# Patient Record
Sex: Male | Born: 1980 | Race: Black or African American | Hispanic: No | Marital: Single | Smoking: Current every day smoker
Health system: Southern US, Community
[De-identification: ages and names within clinical notes are randomized; demographics above are authoritative.]

## PROBLEM LIST (undated history)

## (undated) DIAGNOSIS — I10 Essential (primary) hypertension: Secondary | ICD-10-CM

## (undated) DIAGNOSIS — K649 Unspecified hemorrhoids: Secondary | ICD-10-CM

---

## 2012-06-24 ENCOUNTER — Ambulatory Visit (INDEPENDENT_AMBULATORY_CARE_PROVIDER_SITE_OTHER): Payer: Self-pay | Admitting: General Surgery

## 2012-07-22 ENCOUNTER — Ambulatory Visit (INDEPENDENT_AMBULATORY_CARE_PROVIDER_SITE_OTHER): Payer: Self-pay | Admitting: General Surgery

## 2012-08-26 ENCOUNTER — Encounter (INDEPENDENT_AMBULATORY_CARE_PROVIDER_SITE_OTHER): Payer: Self-pay | Admitting: General Surgery

## 2012-08-26 ENCOUNTER — Ambulatory Visit (INDEPENDENT_AMBULATORY_CARE_PROVIDER_SITE_OTHER): Payer: Self-pay | Admitting: General Surgery

## 2012-08-26 VITALS — BP 122/78 | HR 82 | Temp 98.4°F | Resp 14 | Ht 71.0 in | Wt 196.0 lb

## 2012-08-26 DIAGNOSIS — K648 Other hemorrhoids: Secondary | ICD-10-CM | POA: Insufficient documentation

## 2012-08-26 MED ORDER — HYDROCORTISONE ACETATE 25 MG RE SUPP: 25.0000 mg | Freq: Two times a day (BID) | RECTAL | Status: DC

## 2012-08-26 NOTE — Progress Notes (Signed)
Patient ID: Kyle Rivera, male   DOB: 1981/02/22, 30 y.o.   MRN: [id number]  Chief Complaint  Patient presents with  . Rectal Problems    HPI Kyle Rivera is a 31 y.o. male.  Dr Raenette Rover HPI This is a 31 year old male who is otherwise healthy who presents with about one year ago where he was diagnosed with an anal fissure. At that point in time he was having painful bowel movements and noted some bright red blood on the tissue. He was treated conservatively for an acute fissure with lifestyle changes, fiber, and water. He is not sitting on the toilet for a long time Over the last year he said about 4 or 5 episodes of some occasional discomfort or significant pain associated with some bright red blood that he noticed on the toilet paper. He has one to 2 bowel movements per day. He does not really do any straining. He has some occasional loose stools that are associated with that as well. He has been taking fiber. He comes in today to discuss possible options for his anal bright red blood and pain. History reviewed. No pertinent past medical history.  History reviewed. No pertinent past surgical history.  Family History  Problem Relation Age of Onset  . Heart disease Mother     Social History History  Substance Use Topics  . Smoking status: Never Smoker   . Smokeless tobacco: Not on file  . Alcohol Use: Yes     occ    No Known Allergies  Current Outpatient Prescriptions  Medication Sig Dispense Refill  . hydrocortisone (ANUSOL-HC) 25 MG suppository Place 1 suppository (25 mg total) rectally 2 (two) times daily.  12 suppository  0    Review of Systems Review of Systems  Constitutional: Negative for fever, chills and unexpected weight change.  HENT: Negative for hearing loss, congestion, sore throat, trouble swallowing and voice change.   Eyes: Negative for visual disturbance.  Respiratory: Negative for cough and wheezing.   Cardiovascular: Negative for chest pain, palpitations and  leg swelling.  Gastrointestinal: Positive for diarrhea and anal bleeding. Negative for nausea, vomiting, abdominal pain, constipation, blood in stool, abdominal distention and rectal pain.  Genitourinary: Negative for hematuria and difficulty urinating.  Musculoskeletal: Negative for arthralgias.  Skin: Negative for rash and wound.  Neurological: Negative for seizures, syncope, weakness and headaches.  Hematological: Negative for adenopathy. Does not bruise/bleed easily.  Psychiatric/Behavioral: Negative for confusion.    Blood pressure 122/78, pulse 82, temperature 98.4 F (36.9 C), temperature source Temporal, resp. rate 14, height 5\' 11"  (1.803 m), weight 196 lb (88.905 kg).  Physical Exam Physical Exam  Vitals reviewed. Constitutional: He appears well-developed and well-nourished.  Genitourinary: Rectal exam shows tenderness. Rectal exam shows no external hemorrhoid, no fissure, no mass and anal tone normal. Prostate is not enlarged and not tender.      Assessment    Likely internal hemorrhoids    Plan    We had a  long discussion today about hemorrhoids and fissures. I think on his exam he most likely has internal hemorrhoids as a source of his pain. I did not see any evidence of a chronic fissure today at all. I was however unable to do a really adequate examination with the endoscope due to the fact that he was having pain upon a rectal exam. We'll plan on trying some anusol suppositories and see him back in a few weeks to see the dictated that are so we can do eventually  an adequate exam on him.       Hayla Hinger 08/26/2012, 2:30 PM

## 2012-09-19 ENCOUNTER — Encounter (INDEPENDENT_AMBULATORY_CARE_PROVIDER_SITE_OTHER): Payer: Self-pay | Admitting: General Surgery

## 2012-09-23 ENCOUNTER — Telehealth (INDEPENDENT_AMBULATORY_CARE_PROVIDER_SITE_OTHER): Payer: Self-pay | Admitting: General Surgery

## 2012-09-23 NOTE — Telephone Encounter (Signed)
Pt called for refill on suppositories; last issued 08/26/12.  Called in to [facility]:  [phone]---Hydrocortisone AC 25 mg suppositories, # 12, place 1 suppository rectally up to twice a day prn pain, no refill.

## 2012-10-18 ENCOUNTER — Encounter (INDEPENDENT_AMBULATORY_CARE_PROVIDER_SITE_OTHER): Payer: Self-pay | Admitting: General Surgery

## 2012-11-05 ENCOUNTER — Encounter (INDEPENDENT_AMBULATORY_CARE_PROVIDER_SITE_OTHER): Payer: Self-pay | Admitting: General Surgery

## 2012-11-08 ENCOUNTER — Telehealth (INDEPENDENT_AMBULATORY_CARE_PROVIDER_SITE_OTHER): Payer: Self-pay | Admitting: General Surgery

## 2012-11-08 NOTE — Telephone Encounter (Signed)
Patient called in stating his hemorrhoids have gotten worse and wanted to be seen by Dr. Dwain Rivera. Advised the patient the only appointment (post op) available was 12/05/12. Patient did not seem very coherent and had to repeat the same thing several times to him. Patient expected to be seen today, advised patient Dr. Dwain Rivera is not in clinic this afternoon. His situation did not qualify for urgent office (not thrombosed). Patient advised a message would be sent to Dr. Doreen Salvage nurse and if there was a cancellation and there was time to contact him, the appointment would be moved up. Patient instructed to do warm baths 3 or 4 times per day, increase his water intake and eat smaller meals in order to ease his bowel movements. Patient is not using anything topically to help resolve the hemorrhoids. Advised patient to try prep-h and see if that would help him in the mean time. [name]. Had to be reminded multiple times it was the end of October.

## 2012-12-05 ENCOUNTER — Encounter (INDEPENDENT_AMBULATORY_CARE_PROVIDER_SITE_OTHER): Payer: Self-pay | Admitting: General Surgery

## 2012-12-06 ENCOUNTER — Encounter (INDEPENDENT_AMBULATORY_CARE_PROVIDER_SITE_OTHER): Payer: Self-pay | Admitting: General Surgery

## 2013-01-06 ENCOUNTER — Encounter (INDEPENDENT_AMBULATORY_CARE_PROVIDER_SITE_OTHER): Payer: Self-pay | Admitting: General Surgery

## 2013-03-03 ENCOUNTER — Encounter (INDEPENDENT_AMBULATORY_CARE_PROVIDER_SITE_OTHER): Payer: Self-pay | Admitting: General Surgery

## 2013-05-02 ENCOUNTER — Emergency Department (INDEPENDENT_AMBULATORY_CARE_PROVIDER_SITE_OTHER)
Admission: EM | Admit: 2013-05-02 | Discharge: 2013-05-02 | Disposition: A | Discharge status: Home / Self Care | Payer: BC Managed Care – PPO | Source: Home / Self Care | Attending: Emergency Medicine | Admitting: Emergency Medicine

## 2013-05-02 ENCOUNTER — Encounter (HOSPITAL_COMMUNITY): Payer: Self-pay | Admitting: *Deleted

## 2013-05-02 DIAGNOSIS — K645 Perianal venous thrombosis: Secondary | ICD-10-CM

## 2013-05-02 HISTORY — DX: Unspecified hemorrhoids: K64.9

## 2013-05-02 MED ORDER — IBUPROFEN 800 MG PO TABS: 800.0000 mg | ORAL_TABLET | Freq: Once | ORAL | Status: DC

## 2013-05-02 MED ORDER — PRAMOXINE-HC 1-1 % EX CREA: TOPICAL_CREAM | Freq: Three times a day (TID) | CUTANEOUS | Status: DC

## 2013-05-02 MED ORDER — IBUPROFEN 800 MG PO TABS
ORAL_TABLET | ORAL | Status: AC
  Filled 2013-05-02: qty 1

## 2013-05-02 MED ORDER — IBUPROFEN 800 MG PO TABS
800.0000 mg | ORAL_TABLET | Freq: Once | ORAL | Status: AC
  Administered 2013-05-02: 800 mg via ORAL

## 2013-05-02 MED ORDER — HYDROCORTISONE ACETATE 25 MG RE SUPP: 25.0000 mg | Freq: Two times a day (BID) | RECTAL | Status: DC

## 2013-05-02 MED ORDER — OXYCODONE-ACETAMINOPHEN 5-325 MG PO TABS: ORAL_TABLET | ORAL | Status: DC

## 2013-05-02 NOTE — ED Provider Notes (Signed)
Chief Complaint:   Chief Complaint  Patient presents with  . Hemorrhoids    History of Present Illness:    Kyle Rivera is a 32 year old male who has had a several year history of recurring hemorrhoids. He has seen a Careers adviser for this and was prescribed suppositories. He had a flareup about 3 hours ago with rectal pain, and he can feel something protruding externally. He denies any bleeding. He's had no diarrhea, constipation, abdominal pain, or fever.  Review of Systems:  Other than noted above, the patient denies any of the following symptoms: Constitutional:  No fever, chills, fatigue, weight loss or anorexia. Abdomen:  No nausea, vomiting, hematememesis, melena, constipation, or diarrhea. GU:  No dysuria, frequency, urgency, or hematuria.   Skin:  No rash or itching.  PMFSH:  Past medical history, family history, social history, meds, and allergies were reviewed along with nurse's notes.  No prior abdominal surgeries or history of GI problems.  No use of NSAIDs or aspirin.  No excessive  alcohol intake.    Physical Exam:   Vital signs:  BP 158/107  Pulse 62  Temp(Src) 98.4 F (36.9 C) (Oral)  Resp 16  SpO2 100% Gen:  Alert, oriented, in no distress. Abdomen:  Soft, nontender, no organomegaly or mass. Anal exam: He has 2, small thrombosed external hemorrhoids on the right side. These were very tender to touch. Because of patient discomfort, did not attempt digital rectal exam or endoscopic exam. Skin:  Clear, warm and dry.  No rash.  Course in Urgent Care Center:   For pain he was given ibuprofen 800 mg by mouth.  Assessment:  The encounter diagnosis was Thrombosed external hemorrhoid.  I outlined treatment options with him, I offered incision and drainage of the thrombosed external hemorrhoid, but he does not want to have this done. For conservative treatment, therefore was given Pramoxine/hydrocortisone cream, Anusol-HC suppositories, and Percocet for the pain. He was referred to  see surgery if these should persist. Also suggested sitz baths, stool softeners, and use of baby wipes for cleansing.  Plan: 1.  The following meds were prescribed:   New Prescriptions   HYDROCORTISONE (ANUSOL-HC) 25 MG SUPPOSITORY    Place 1 suppository (25 mg total) rectally 2 (two) times daily.   OXYCODONE-ACETAMINOPHEN (PERCOCET) 5-325 MG PER TABLET    1 to 2 tablets every 6 hours as needed for pain.   PRAMOXINE-HYDROCORTISONE (PRAMOSONE) CREAM    Apply topically 3 (three) times daily.   2.  The patient was instructed in symptomatic care and handouts were given. 3.  The patient was told to return if becoming worse in any way, if no better in 3 or 4 days, and given some red flag symptoms such as rectal bleeding or increasing pain  that would indicate earlier return. 4.  Follow up  with Dr. Avel Peace if symptoms should persist.      Reuben Likes, MD 05/02/13 2029

## 2013-05-02 NOTE — ED Notes (Signed)
C/O hemorrhoidal pain x 3 hrs without bleeding.  Has tried lido application without any relief.

## 2013-05-07 ENCOUNTER — Encounter (HOSPITAL_COMMUNITY): Payer: Self-pay | Admitting: Emergency Medicine

## 2013-05-07 ENCOUNTER — Telehealth (HOSPITAL_COMMUNITY): Payer: Self-pay | Admitting: *Deleted

## 2013-05-07 ENCOUNTER — Emergency Department (HOSPITAL_COMMUNITY)
Admission: EM | Admit: 2013-05-07 | Discharge: 2013-05-07 | Disposition: A | Discharge status: Home / Self Care | Payer: BC Managed Care – PPO | Source: Home / Self Care | Attending: Emergency Medicine | Admitting: Emergency Medicine

## 2013-05-07 DIAGNOSIS — K645 Perianal venous thrombosis: Secondary | ICD-10-CM

## 2013-05-07 MED ORDER — HYDROCORTISONE ACETATE 25 MG RE SUPP: 25.0000 mg | Freq: Two times a day (BID) | RECTAL | Status: DC

## 2013-05-07 MED ORDER — IBUPROFEN 800 MG PO TABS: 800.0000 mg | ORAL_TABLET | Freq: Three times a day (TID) | ORAL | Status: DC | PRN

## 2013-05-07 NOTE — ED Provider Notes (Signed)
History    CSN: [id number] Arrival date & time 05/07/13  4098  First MD Initiated Contact with Patient 05/07/13 0845     Chief Complaint  Patient presents with  . Hemorrhoids   (Consider location/radiation/quality/duration/timing/severity/associated sxs/prior Treatment) HPI Comments: Patient presents urgent care with ongoing rectal pain from a thrombosed hemorrhoid as he was diagnosed on July 4. At that time patient refused to have any surgical procedure to evacuate the thrombosed Extrenal Hem. He has been taking Percocet and using rectal suppositories and applying a prescribed cream. He continues to have pain.Kyle Rivera  decided to come in to have this procedure done. He has seen a surgeon in the past for hemorrhoids as well.   He opted to come here and in did not call his Careers adviser.   Patient denies any fevers or abdominal pain associated with this rectal pain.   The history is provided by the patient.   Past Medical History  Diagnosis Date  . Hemorrhoid    History reviewed. No pertinent past surgical history. Family History  Problem Relation Age of Onset  . Heart disease Mother    History  Substance Use Topics  . Smoking status: Current Every Day Smoker  . Smokeless tobacco: Not on file  . Alcohol Use: Yes     Comment: occasional    Review of Systems  Constitutional: Negative for fever, activity change and appetite change.  Gastrointestinal: Positive for rectal pain. Negative for nausea, vomiting, abdominal pain, diarrhea, blood in stool, abdominal distention and anal bleeding.  Genitourinary: Negative for dysuria, flank pain, discharge and genital sores.  Skin: Negative for color change and rash.    Allergies  Review of patient's allergies indicates no known allergies.  Home Medications   Current Outpatient Rx  Name  Route  Sig  Dispense  Refill  . oxyCODONE-acetaminophen (PERCOCET) 5-325 MG per tablet      1 to 2 tablets every 6 hours as needed for  pain.   20 tablet   0   . pramoxine-hydrocortisone (PRAMOSONE) cream   Topical   Apply topically 3 (three) times daily.   30 g   0   . hydrocortisone (ANUSOL-HC) 25 MG suppository   Rectal   Place 1 suppository (25 mg total) rectally 2 (two) times daily.   12 suppository   0   . hydrocortisone (ANUSOL-HC) 25 MG suppository   Rectal   Place 1 suppository (25 mg total) rectally 2 (two) times daily.   12 suppository   0   . ibuprofen (ADVIL,MOTRIN) 800 MG tablet   Oral   Take 1 tablet (800 mg total) by mouth every 8 (eight) hours as needed for pain.   15 tablet   0    BP 158/102  Pulse 65  Temp(Src) 98.3 F (36.8 C) (Oral)  Resp 18  SpO2 97% Physical Exam  Vitals reviewed. Constitutional: He appears well-developed and well-nourished.  Abdominal: Soft. There is no tenderness. Hernia confirmed negative in the right inguinal area and confirmed negative in the left inguinal area.  Genitourinary: Rectal exam shows external hemorrhoid. Rectal exam shows no fissure, no mass and anal tone normal. Right testis shows no tenderness. Left testis shows no tenderness. No penile tenderness.     Neurological: He is alert.  Skin: No erythema.    ED Course  INCISION AND DRAINAGE Date/Time: 05/07/2013 9:41 AM Performed by: Bradin Mcadory Authorized by: Jimmie Molly Consent: Verbal consent obtained. Consent given by: patient Patient understanding: patient states understanding of  the procedure being performed Type: hematoma Body area: anogenital Location details: perianal Anesthesia: local infiltration Local anesthetic: lidocaine 1% without epinephrine (5) Anesthetic total: 5 ml Scalpel size: 11 Incision type: elliptical Complexity: simple Drainage: bloody Wound treatment: wound left open Comments: Excised external hemorrhoid.  After application of local anesthetic and testing patient did not perceive a needle or scalpel puncture-attempts. But during withdrawal procedure  Describing pain at times.    (including critical care time) Labs Reviewed - No data to display No results found. 1. Thrombosed hemorrhoids    Of note patient seemed not to tolerate procedure well despite the use of local anesthetic insufficient amounts local 2 hemorrhoids. MDM   Problem #1 External thrombosed hemorrhoid.  Patient apprehensive anxious during procedure.  Patient has been referred to see his surgeon that he has seen in the past for hemorrhoids.  Have discussed with patient if no significant improvement in 48 hours to call his surgeon. As a was recommended on July 4th on his first visit.  Jimmie Molly, MD 05/07/13 [phone]

## 2013-05-07 NOTE — ED Notes (Signed)
States he has hemorrhoids on and off but recently since July 3.   Patient came here and was given medication and a return trip here or to [place] surgery.  Patient states he was waiting to see if the hemorrhoid was going to subside.

## 2013-05-07 NOTE — ED Notes (Signed)
Pt. called on VM and c/o burning pain after hemorrhoid surgery today.  I called pt. back and asked what he was taking for pain. He said Ibuprofen 800 mg.  I accessed his chart and asked him if he was using the Pramoxine cream. He said it burns. I told him it would burn at first but it would help to numb it up.  He is not using the Anusol supp. either. He asked if it will help with the cut?  I told him to try both the supp. and the cream and see if they will help.  Keep taking the Ibuprofen every 8 hrs.  Pt. thanked me for calling back. [name] 05/07/2013

## 2013-05-10 ENCOUNTER — Emergency Department (INDEPENDENT_AMBULATORY_CARE_PROVIDER_SITE_OTHER)
Admission: EM | Admit: 2013-05-10 | Discharge: 2013-05-10 | Disposition: A | Discharge status: Home / Self Care | Payer: BC Managed Care – PPO | Source: Home / Self Care | Attending: Emergency Medicine | Admitting: Emergency Medicine

## 2013-05-10 ENCOUNTER — Encounter (HOSPITAL_COMMUNITY): Payer: Self-pay | Admitting: *Deleted

## 2013-05-10 DIAGNOSIS — K649 Unspecified hemorrhoids: Secondary | ICD-10-CM

## 2013-05-10 MED ORDER — HYDROCORTISONE ACETATE 25 MG RE SUPP: 25.0000 mg | Freq: Two times a day (BID) | RECTAL | Status: DC

## 2013-05-10 MED ORDER — HYDROCODONE-ACETAMINOPHEN 5-325 MG PO TABS: 2.0000 | ORAL_TABLET | ORAL | Status: DC | PRN

## 2013-05-10 NOTE — ED Notes (Signed)
Pt  Had     questians   About  His  Discharge    Plan of  Care         Kyle Rivera  Returned  And  Spoke  To the  Pt  At  [employer]

## 2013-05-10 NOTE — ED Notes (Signed)
Pt  Advised  The  Importance  Of        Avoiding  Constipation           To  Take  Stool  softners          And  Advised  The  Hydrocodone  May  Make  Him  Constipated

## 2013-05-10 NOTE — ED Provider Notes (Signed)
Medical screening examination/treatment/procedure(s) were performed by non-physician practitioner and as supervising physician I was immediately available for consultation/collaboration.  Raynald Blend, MD 05/10/13 [phone]

## 2013-05-10 NOTE — ED Notes (Signed)
Pt  Was  Seen   sev  Days  Ago  For  Thrombosed  hemmorhoid         He  Reports  Pain  When  He  Has  A  bm  And  He  Feels as  If  His  Rectum is  Swollen    He  States  He   Has  Taken all of  His  RX      And  Has  Been  Using  His       Own  Stool  softner      He  denys  Any bleeding

## 2013-05-10 NOTE — ED Provider Notes (Signed)
History    CSN: [id number] Arrival date & time 05/10/13  1354  First MD Initiated Contact with Patient 05/10/13 1531     Chief Complaint  Patient presents with  . Hemorrhoids   (Consider location/radiation/quality/duration/timing/severity/associated sxs/prior Treatment) Patient is a 32 y.o. male presenting with wound check. The history is provided by the patient. No language interpreter was used.  Wound Check This is a new problem. The problem occurs constantly. The problem has not changed since onset.Nothing aggravates the symptoms. Nothing relieves the symptoms. He has tried nothing for the symptoms. The treatment provided no relief.  Pt reports he had Hemorid drained 3 days.  Pt reports he is having worse pain now. Hemmorrhoid has not decreased in size.  Pt thinks areas are worse.   Pt is out of Anusol suppositories.   Past Medical History  Diagnosis Date  . Hemorrhoid    History reviewed. No pertinent past surgical history. Family History  Problem Relation Age of Onset  . Heart disease Mother    History  Substance Use Topics  . Smoking status: Current Every Day Smoker  . Smokeless tobacco: Not on file  . Alcohol Use: Yes     Comment: occasional    Review of Systems  Gastrointestinal: Positive for rectal pain.  All other systems reviewed and are negative.    Allergies  Review of patient's allergies indicates no known allergies.  Home Medications   Current Outpatient Rx  Name  Route  Sig  Dispense  Refill  . hydrocortisone (ANUSOL-HC) 25 MG suppository   Rectal   Place 1 suppository (25 mg total) rectally 2 (two) times daily.   12 suppository   0   . hydrocortisone (ANUSOL-HC) 25 MG suppository   Rectal   Place 1 suppository (25 mg total) rectally 2 (two) times daily.   12 suppository   0   . ibuprofen (ADVIL,MOTRIN) 800 MG tablet   Oral   Take 1 tablet (800 mg total) by mouth every 8 (eight) hours as needed for pain.   15 tablet   0   .  oxyCODONE-acetaminophen (PERCOCET) 5-325 MG per tablet      1 to 2 tablets every 6 hours as needed for pain.   20 tablet   0   . pramoxine-hydrocortisone (PRAMOSONE) cream   Topical   Apply topically 3 (three) times daily.   30 g   0    BP 152/96  Pulse 77  Temp(Src) 99.8 F (37.7 C) (Oral)  Resp 16  SpO2 98% Physical Exam  Nursing note and vitals reviewed. Constitutional: He appears well-developed and well-nourished.  HENT:  Head: Normocephalic.  Cardiovascular: Normal rate.   Pulmonary/Chest: Effort normal.  Genitourinary:  Pt has healing small incision,   Small thrombosed hemmoroid at incision site and beside  Musculoskeletal: Normal range of motion.  Neurological: He is alert.  Skin: Skin is warm.    ED Course  Procedures (including critical care time) Labs Reviewed - No data to display No results found. 1. Hemorrhoid     MDM  Pt concerned because incision did not make area go away.   I advised him that sometimes the hemorrhoidal vein can make more clot.   Pt advised to continue to use anusol.   Pt has multiple concerns.   I advised him to see [place] surgery.   I do not see tissue that requires current intervention.   Pt is given rx for hydrocodone for pain and anusol.   (Pt  says he can not see surgeon for a month)   Pt is given rx for a box of 100 suppositories as he is worried he will run out and continue having hemorrhoid problems.  Lonia Skinner [place], PA-C 05/10/13 1627

## 2017-07-31 ENCOUNTER — Emergency Department (HOSPITAL_COMMUNITY): Payer: Self-pay

## 2017-07-31 ENCOUNTER — Encounter (HOSPITAL_COMMUNITY): Payer: Self-pay | Admitting: Nurse Practitioner

## 2017-07-31 ENCOUNTER — Emergency Department (HOSPITAL_COMMUNITY)
Admission: EM | Admit: 2017-07-31 | Discharge: 2017-07-31 | Disposition: A | Discharge status: Home / Self Care | Payer: Self-pay | Attending: Emergency Medicine | Admitting: Emergency Medicine

## 2017-07-31 DIAGNOSIS — F17210 Nicotine dependence, cigarettes, uncomplicated: Secondary | ICD-10-CM | POA: Insufficient documentation

## 2017-07-31 DIAGNOSIS — N503 Cyst of epididymis: Secondary | ICD-10-CM | POA: Insufficient documentation

## 2017-07-31 NOTE — ED Notes (Signed)
Pt still refusing vitals.  

## 2017-07-31 NOTE — ED Triage Notes (Signed)
Patient states he noticed a small nodule on his right testicle today. Denies any pain or swelling at site. Denies any fever, heavy lifting, or pulling. Denies any discharge or bleeding from penis.

## 2017-07-31 NOTE — ED Notes (Signed)
PATIENT REFUSED TO HAVE VITAL SIGNS DONE.

## 2017-07-31 NOTE — ED Provider Notes (Signed)
[facility]-EMERGENCY DEPT Provider Note   CSN: [id number] Arrival date & time: 07/31/17  1141     History   Chief Complaint Chief Complaint  Patient presents with  . Groin Pain    HPI Kyle Rivera is a 36 y.o. male.  HPI   Kyle Rivera is a 36yo male with a history of hypertension, tobacco use who presents to the emergency department for evaluation of right sided testicular mass. Patient states that he has had this for several years, never got around to having it checked. He states that the mass is freely mobile, round. Endorses a mild 3/10 aching pain when he presses on the spot. Denies history of cancer, trauma. No family history of testicular mass that he is aware of. Denies fever, chills, unexpected weight change, erythema or swelling, abdominal pain, nausea/vomiting, hematuria, dysuria, penile discharge, penile pain, flank pain. He states that he is sexually active with one male partner. Is not concerned for STD today. He states that he is very anxious and nervous because he is worried that this is cancer.  Past Medical History:  Diagnosis Date  . Hemorrhoid     Patient Active Problem List   Diagnosis Date Noted  . Internal hemorrhoids 08/26/2012    History reviewed. No pertinent surgical history.     Home Medications    Prior to Admission medications   Not on File    Family History Family History  Problem Relation Age of Onset  . Heart disease Mother     Social History Social History  Substance Use Topics  . Smoking status: Current Every Day Smoker  . Smokeless tobacco: Never Used  . Alcohol use Yes     Comment: occasional     Allergies   Patient has no known allergies.   Review of Systems Review of Systems  Constitutional: Negative for chills, fatigue, fever and unexpected weight change.  Gastrointestinal: Negative for abdominal pain, diarrhea, nausea and vomiting.  Genitourinary: Positive for scrotal swelling and testicular pain. Negative for  decreased urine volume, difficulty urinating, discharge, dysuria, frequency, genital sores, hematuria and penile pain.  Musculoskeletal: Negative for arthralgias.  Skin: Negative for rash and wound.  Psychiatric/Behavioral: The patient is nervous/anxious.      Physical Exam Updated Vital Signs Ht  (1.803 m)   Wt 81.6 kg (180 lb)   BMI 25.10 kg/m   Physical Exam  Constitutional: He is oriented to person, place, and time. He appears well-developed and well-nourished. No distress.  Patient appears anxious  HENT:  Head: Normocephalic and atraumatic.  Eyes: Right eye exhibits no discharge. Left eye exhibits no discharge.  Pulmonary/Chest: Effort normal. No respiratory distress.  Abdominal: Soft. Bowel sounds are normal. There is no tenderness.  No CVA tenderness.   Musculoskeletal:  Chaperone present for exam. Right testicular mass noted on the superior pole of the testicle, pea-sized and feels irregular. Mildly painful to touch. No inguinal adenopathy appreciated. No discharge from penis. No signs of lesion or erythema on the penis or testicles. The penis is non-tender. No signs of any inguinal hernias.    Neurological: He is alert and oriented to person, place, and time. Coordination normal.  Skin: Skin is warm and dry. Capillary refill takes less than 2 seconds. No rash noted. He is not diaphoretic. No erythema.  Psychiatric: He has a normal mood and affect. His behavior is normal.  Nursing note and vitals reviewed.    ED Treatments / Results  Labs (all labs ordered are listed, but  only abnormal results are displayed) Labs Reviewed - No data to display  EKG  EKG Interpretation None       Radiology US Scrotum  Result Date: 07/31/2017 CLINICAL DATA:  Right testicular mass for several years EXAM: SCROTAL ULTRASOUND DOPPLER ULTRASOUND OF THE TESTICLES TECHNIQUE: Complete ultrasound examination of the testicles, epididymis, and other scrotal structures was performed.  Color and spectral Doppler ultrasound were also utilized to evaluate blood flow to the testicles. COMPARISON:  None. FINDINGS: Right testicle Measurements: 3.6 x 1.9 x 2.6 cm. No mass or microlithiasis visualized. Left testicle Measurements: 3.4 x 2.2 x 2.5 cm. No mass or microlithiasis visualized. Right epididymis:  Cyst measuring 0.7 x 0.7 x 0.8 cm Left epididymis:  Normal in size and appearance. Hydrocele:  Bilateral hydroceles, right greater than left Varicocele:  Small right varicocele Pulsed Doppler interrogation of both testes demonstrates normal low resistance arterial and venous waveforms bilaterally. IMPRESSION: 1. Negative for testicular torsion or solid or cystic mass within the testes. 2. Right epididymal cyst measuring 8 mm 3. Small medium bilateral hydroceles 4. Small right varicocele, could consider nonemergent CT imaging to exclude retroperitoneal abnormality. Electronically Signed   By: Jasmine Pang M.D.   On: 07/31/2017 17:12   [place] Art/ven Flow Abd Pelv Doppler  Result Date: 07/31/2017 CLINICAL DATA:  Right testicular mass for several years EXAM: SCROTAL ULTRASOUND DOPPLER ULTRASOUND OF THE TESTICLES TECHNIQUE: Complete ultrasound examination of the testicles, epididymis, and other scrotal structures was performed. Color and spectral Doppler ultrasound were also utilized to evaluate blood flow to the testicles. COMPARISON:  None. FINDINGS: Right testicle Measurements: 3.6 x 1.9 x 2.6 cm. No mass or microlithiasis visualized. Left testicle Measurements: 3.4 x 2.2 x 2.5 cm. No mass or microlithiasis visualized. Right epididymis:  Cyst measuring 0.7 x 0.7 x 0.8 cm Left epididymis:  Normal in size and appearance. Hydrocele:  Bilateral hydroceles, right greater than left Varicocele:  Small right varicocele Pulsed Doppler interrogation of both testes demonstrates normal low resistance arterial and venous waveforms bilaterally. IMPRESSION: 1. Negative for testicular torsion or solid or cystic mass  within the testes. 2. Right epididymal cyst measuring 8 mm 3. Small medium bilateral hydroceles 4. Small right varicocele, could consider nonemergent CT imaging to exclude retroperitoneal abnormality. Electronically Signed   By: Jasmine Pang M.D.   On: 07/31/2017 17:12    Procedures Procedures (including critical care time)  Medications Ordered in ED Medications - No data to display   Initial Impression / Assessment and Plan / ED Course  I have reviewed the triage vital signs and the nursing notes.  Pertinent labs & imaging results that were available during my care of the patient were reviewed by me and considered in my medical decision making (see chart for details).    Ultrasound reveals 8 mm epididymal cyst located on the right testicle. Negative for testicular torsion, negative for solid or cystic testicular mass. I have provided patient information to follow up with urology. There is also a small right varicocele, have informed patient that Ultrasound suggests he get a non-emergent CT scan to evaluate for this. Vital signs are stable. Have discussed return precautions including new or worsening testicular pain or any other new or concerning symptoms. Discussed this patient with Dr. Criss Alvine who agrees with plan.  Final Clinical Impressions(s) / ED Diagnoses   Final diagnoses:  Epididymal cyst    New Prescriptions New Prescriptions   No medications on file     Kellie Shropshire, Cordelia Poche 07/31/17 1749  Pricilla Loveless, MD 08/01/17 [phone]

## 2017-07-31 NOTE — Discharge Instructions (Signed)
Your ultrasound revealed that you have an 8 mm epididymal cyst located on the right testicle. Ultrasound was negative for testicular torsion, negative for solid or cystic testicular mass.  You may follow-up with a urologist regarding the cyst. I have listed information to follow up with Dr.Herrick.   Please return to the emergency department if you develop worsening testicular pain, fever or any new or concerning symptoms.

## 2017-08-02 ENCOUNTER — Emergency Department (HOSPITAL_COMMUNITY)
Admission: EM | Admit: 2017-08-02 | Discharge: 2017-08-09 | Disposition: A | Discharge status: Home / Self Care | Payer: Self-pay | Attending: Emergency Medicine | Admitting: Emergency Medicine

## 2017-08-02 ENCOUNTER — Encounter (HOSPITAL_COMMUNITY): Payer: Self-pay | Admitting: Emergency Medicine

## 2017-08-02 DIAGNOSIS — IMO0001 Reserved for inherently not codable concepts without codable children: Secondary | ICD-10-CM | POA: Diagnosis present

## 2017-08-02 DIAGNOSIS — I10 Essential (primary) hypertension: Secondary | ICD-10-CM | POA: Insufficient documentation

## 2017-08-02 DIAGNOSIS — F17210 Nicotine dependence, cigarettes, uncomplicated: Secondary | ICD-10-CM | POA: Insufficient documentation

## 2017-08-02 DIAGNOSIS — F259 Schizoaffective disorder, unspecified: Secondary | ICD-10-CM | POA: Insufficient documentation

## 2017-08-02 DIAGNOSIS — F419 Anxiety disorder, unspecified: Secondary | ICD-10-CM

## 2017-08-02 HISTORY — DX: Essential (primary) hypertension: I10

## 2017-08-02 LAB — CBC
HCT: 48.2 % (ref 39.0–52.0)
Hemoglobin: 17.8 g/dL — ABNORMAL HIGH (ref 13.0–17.0)
MCH: 29.5 pg (ref 26.0–34.0)
MCHC: 36.9 g/dL — ABNORMAL HIGH (ref 30.0–36.0)
MCV: 79.8 fL (ref 78.0–100.0)
PLATELETS: 279 10*3/uL (ref 150–400)
RBC: 6.04 MIL/uL — ABNORMAL HIGH (ref 4.22–5.81)
RDW: 13.2 % (ref 11.5–15.5)
WBC: 11.4 10*3/uL — AB (ref 4.0–10.5)

## 2017-08-02 LAB — RAPID URINE DRUG SCREEN, HOSP PERFORMED
Amphetamines: NOT DETECTED
BENZODIAZEPINES: NOT DETECTED
Barbiturates: NOT DETECTED
Cocaine: NOT DETECTED
OPIATES: NOT DETECTED
Tetrahydrocannabinol: NOT DETECTED

## 2017-08-02 LAB — COMPREHENSIVE METABOLIC PANEL
ALT: 15 U/L — ABNORMAL LOW (ref 17–63)
AST: 31 U/L (ref 15–41)
Albumin: 4.6 g/dL (ref 3.5–5.0)
Alkaline Phosphatase: 79 U/L (ref 38–126)
Anion gap: 10 (ref 5–15)
BILIRUBIN TOTAL: 1 mg/dL (ref 0.3–1.2)
BUN: 7 mg/dL (ref 6–20)
CO2: 21 mmol/L — ABNORMAL LOW (ref 22–32)
Calcium: 9.4 mg/dL (ref 8.9–10.3)
Chloride: 107 mmol/L (ref 101–111)
Creatinine, Ser: 1 mg/dL (ref 0.61–1.24)
Glucose, Bld: 110 mg/dL — ABNORMAL HIGH (ref 65–99)
Potassium: 3.5 mmol/L (ref 3.5–5.1)
Sodium: 138 mmol/L (ref 135–145)
TOTAL PROTEIN: 7.8 g/dL (ref 6.5–8.1)

## 2017-08-02 LAB — SALICYLATE LEVEL

## 2017-08-02 LAB — ETHANOL

## 2017-08-02 LAB — ACETAMINOPHEN LEVEL: Acetaminophen (Tylenol), Serum: 12 ug/mL (ref 10–30)
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
## 2017-08-03 DIAGNOSIS — R454 Irritability and anger: Secondary | ICD-10-CM

## 2017-08-03 DIAGNOSIS — R451 Restlessness and agitation: Secondary | ICD-10-CM

## 2017-08-03 DIAGNOSIS — IMO0001 Reserved for inherently not codable concepts without codable children: Secondary | ICD-10-CM | POA: Diagnosis present

## 2017-08-03 DIAGNOSIS — R45851 Suicidal ideations: Secondary | ICD-10-CM

## 2017-08-03 DIAGNOSIS — F2081 Schizophreniform disorder: Secondary | ICD-10-CM

## 2017-08-03 DIAGNOSIS — R4586 Emotional lability: Secondary | ICD-10-CM

## 2017-08-03 DIAGNOSIS — F17210 Nicotine dependence, cigarettes, uncomplicated: Secondary | ICD-10-CM

## 2017-08-03 DIAGNOSIS — R4587 Impulsiveness: Secondary | ICD-10-CM

## 2017-08-03 DIAGNOSIS — F22 Delusional disorders: Secondary | ICD-10-CM

## 2017-08-03 DIAGNOSIS — K648 Other hemorrhoids: Secondary | ICD-10-CM

## 2017-08-03 MED ORDER — LISINOPRIL 20 MG PO TABS
20.0000 mg | ORAL_TABLET | Freq: Every day | ORAL | Status: DC
  Filled 2017-08-03 (×7): qty 1

## 2017-08-03 MED ORDER — STERILE WATER FOR INJECTION IJ SOLN
INTRAMUSCULAR | Status: AC
  Administered 2017-08-03: 10:00:00
  Filled 2017-08-03: qty 10

## 2017-08-03 MED ORDER — RISPERIDONE 1 MG PO TABS
1.0000 mg | ORAL_TABLET | Freq: Two times a day (BID) | ORAL | Status: DC
  Filled 2017-08-03 (×5): qty 1

## 2017-08-03 MED ORDER — DIPHENHYDRAMINE HCL 50 MG/ML IJ SOLN
50.0000 mg | Freq: Once | INTRAMUSCULAR | Status: AC
  Administered 2017-08-03: 50 mg via INTRAMUSCULAR
  Filled 2017-08-03: qty 1

## 2017-08-03 MED ORDER — ZIPRASIDONE MESYLATE 20 MG IM SOLR
20.0000 mg | Freq: Once | INTRAMUSCULAR | Status: AC
  Administered 2017-08-03: 20 mg via INTRAMUSCULAR
  Filled 2017-08-03: qty 20

## 2017-08-03 MED ORDER — TRAZODONE HCL 50 MG PO TABS
50.0000 mg | ORAL_TABLET | Freq: Every day | ORAL | Status: DC
  Filled 2017-08-03 (×3): qty 1

## 2017-08-03 MED ORDER — GABAPENTIN 300 MG PO CAPS
300.0000 mg | ORAL_CAPSULE | Freq: Two times a day (BID) | ORAL | Status: DC
  Filled 2017-08-03 (×5): qty 1

## 2017-08-03 MED ORDER — LORAZEPAM 2 MG/ML IJ SOLN
2.0000 mg | Freq: Once | INTRAMUSCULAR | Status: AC
  Administered 2017-08-03: 2 mg via INTRAMUSCULAR
  Filled 2017-08-03: qty 1

## 2017-08-03 NOTE — [facility] Assessment (Signed)
[facility] Assessment Progress Note  Per Thedore Mins, MD, this pt requires psychiatric hospitalization at this time.  Pt presents under IVC initiated by law enforcement, and upheld by Dr Jannifer Franklin.  The following facilities have been contacted to seek placement for this pt, with results as noted:  Beds available, information sent, decision pending:  [facility]   At capacity:  [facility] (no high acuity) [facility] (no high acuity) [facility] (no male beds) [facility] [place], [place] Triage Specialist [phone]
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
## 2017-08-03 NOTE — Progress Notes (Signed)
08/03/17 1340:  LRT went to pt room to offer activities, pt was sleep.   Caroll Rancher, LRT/CTRS

## 2017-08-03 NOTE — ED Notes (Signed)
Patient admitted on unit. Sated he doesn't know why he is here against his will and was brought here out of hatred. Staff explained to patient why he is here under IVC and patient agreed to talk to the Dr in the morning.  Staff offered support and encouragement as needed. Routine safety checks maintained. Will continue to monitor patient for safety and stability

## 2017-08-03 NOTE — ED Notes (Signed)
Pt stated "I wanted to see the papers the [facility] showed me again.  So, I will have to wait until morning to see the doctor?"  Pt informed this writer is unable to provide IVC paperwork for viewing and psychiatrist will round in the morning.   Pt verbalized understanding.

## 2017-08-03 NOTE — ED Notes (Signed)
Pt stated to sitter, "I'm on a way higher level than you because I make 6 figures and you will never understand.  He's a black male, and there's a whole lot of minorities here.  I wasn't supposed to be here."

## 2017-08-03 NOTE — ED Notes (Signed)
TTS assessment in progress. 

## 2017-08-03 NOTE — ED Notes (Signed)
Introduced self to patient. Pt oriented to unit expectations.  Assessed pt for:  A) Anxiety &/or agitation: Pt has been paranoid and preoccupied and repetitive about his rights being violated He became angry after talking to Dr. Jannifer Franklin because he is going to be [facility] to a [facility] for further treatment. He stood at the nurses station window yelling and demanding to be allowed to leave. He was not violent. IM medications were ordered, Geodon 20, Ativan 2 and benadryl 50. Pt would not allow the medication to be given and was held down for the purpose of medication administration.   S) Safety: Safety maintained with q-15-minute checks and hourly rounds by staff.  A) ADLs: Pt able to perform ADLs independently.  ) Pick-Up (room cleanliness): Pt's room clean and free of clutter.

## 2017-08-03 NOTE — ED Notes (Signed)
Pt woke up and immediately made a telephone call. He continues to be irritable.

## 2017-08-03 NOTE — ED Notes (Signed)
SBAR Report received from previous nurse. Pt received anxious but visible on unit. Pt denies current SI/ HI, A/V H, depression, anxiety, or pain at this time, and appears otherwise stable and free of distress. Pt reminded of camera surveillance, q 15 min rounds, and rules of the milieu. Will continue to assess.

## 2017-08-04 MED ORDER — LORAZEPAM 1 MG PO TABS: 1.0000 mg | ORAL_TABLET | Freq: Four times a day (QID) | ORAL | Status: DC | PRN

## 2017-08-04 MED ORDER — IBUPROFEN 200 MG PO TABS: 600.0000 mg | ORAL_TABLET | ORAL | Status: DC | PRN

## 2017-08-04 MED ORDER — ACETAMINOPHEN 500 MG PO TABS
500.0000 mg | ORAL_TABLET | Freq: Four times a day (QID) | ORAL | Status: DC | PRN
  Administered 2017-08-08: 500 mg via ORAL
  Filled 2017-08-04: qty 1

## 2017-08-04 NOTE — ED Notes (Signed)
SBAR Report received from previous nurse. Pt received calm and visible on unit. Pt denies current SI/ HI, A/V H, depression, anxiety, or pain at this time, and appears otherwise stable and free of distress. Pt reminded of camera surveillance, q 15 min rounds, and rules of the milieu. Will continue to assess. 

## 2017-08-04 NOTE — BH Assessment (Addendum)
[facility] Assessment Progress Note This Clinical research associate spoke with patient this date to evaluate treatment progress. Patient presents with a pleasant affect although continues to have concerns in reference to why he is not being discharged. This writer explained the IVC process and details associated with his case that warranted continued care. Patient denies content of IVC and renders conflicting information in reference to the events that transpired prior to his admission. Patient continues to voice concerns that the local media outlets and some "unknown neighbor" has been following him trying to "frame him." Patient also reported to this writer that the media has been collecting "personal information" on him. Patient was observed to be getting anxious as evaluation continued. Case was staffed with Jonnalagadda MD who recommended continued inpatient monitoring as appropriate bed placement is investigated.

## 2017-08-04 NOTE — ED Notes (Signed)
Pt refused all scheduled medication. Spoke with pt about need for meds, including indications. He was polite and calm. He expressed interest in speaking with Theodoro Grist from TTS; message was conveyed.

## 2017-08-04 NOTE — ED Notes (Signed)
Pt has ben to the desk several times this evening asking the same questions about if he has been accepted anywhere, what the hold up is, can he see his file.

## 2017-08-04 NOTE — ED Notes (Signed)
Introduced self to pt. Pt denied SI, HI, and AVH. Described mood as "trying to stay hopeful." Pt remains safe with checks, rounds, and camera monitoring.

## 2017-08-05 DIAGNOSIS — R450 Nervousness: Secondary | ICD-10-CM

## 2017-08-05 DIAGNOSIS — F419 Anxiety disorder, unspecified: Secondary | ICD-10-CM

## 2017-08-05 DIAGNOSIS — F259 Schizoaffective disorder, unspecified: Secondary | ICD-10-CM

## 2017-08-05 NOTE — BH Assessment (Addendum)
BHH Assessment Progress Note  Spoke with pt and his GF Aline August [phone]. She was a witness to the incident with the news crew that came to the house after complaints were put up on [employer] by a neighbor.  She said that she did not see anyone upset at all while pt was talking to them in front of the house, and she walked up to them and both the cameraman and reporter smiled and seemed calm, so she does not believe that they were threatened. She is frustrated that Patent examiner did not want her eye witness account and has contacted pt's attorney.  She has lived with pt for 1 1/2 months and states that she has not seen anything to concern her at all. She confirms that pt works for [employer] as an Art gallery manager, and that he has a book on [employer] from an [place] in an age of [place]".   They state that pt's firearms have been confiscated by law enforcement--except that the receipt that law enforcement gave them has 3 guns on it and he states that he has 4 guns.   They are concerned about IVC and how long it lasts because pt does not want IP treatment. Explained IVC and evaluation process and that pt will be re-evaluated in the am with this collateral information.

## 2017-08-05 NOTE — Consult Note (Signed)
[facility] Face-to-Face Psychiatry Consult   Reason for Consult:  Danger to self and others Referring Physician:  IVC Patient Identification: Kyle Rivera MRN:  [id number] Principal Diagnosis: Schizophreniform psychosis, affective type [facility]) Diagnosis:   Patient Active Problem List   Diagnosis Date Noted  . 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 Internal hemorrhoids [K64.8] 08/26/2012    Total Time spent with patient: 45 minutes  Subjective:   Kyle Rivera is a 36 y.o. male patient admitted under IVC by [facility] after making threats to news crew and following people around and making them afraid.  HPI:  Pt was seen and chart reviewed with treatment team. Pt stated he had a miscommunication with a neighbor. Pt reported he had weapons in his home but the police confiscated them when this happened. Per IVC, Pt is a danger to himself and the community at large. 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 Pt lives with his significant other. Pt appeared tangential, grandiose, with pressured speech. Pt has declined all medications while in the SAP[facility] but has remained calm and unintrusive. Inpatient psychiatric admission recommended.  Past Psychiatric History: As above  Risk to Self: No denies Risk to Others: Denies Prior Inpatient Therapy: Prior Inpatient Therapy: No (Pt denies. ) Prior Therapy Dates: NA Prior Therapy Facilty/Provider(s): NA Reason for Treatment: NA Prior Outpatient Therapy: Prior Outpatient Therapy: No (Pt denies. ) Prior Therapy Dates: NA Prior Therapy Facilty/Provider(s): NA Reason for Treatment: NA Does patient have an ACCT team?: No Does patient have Intensive In-House Services?  : No Does patient have [facility] services? : No Does patient have P4CC services?: No  Past Medical History:  Past Medical History:  Diagnosis Date  . Hemorrhoid   . Hypertension    History reviewed. No  pertinent surgical history. Family History:  Family History  Problem Relation Age of Onset  . Heart disease Mother    Family Psychiatric  History:Unknown Social History:  History  Alcohol Use  . Yes    Comment: occasional     History  Drug Use No    Social History   Social History  . Marital status: Single    Spouse name: N/A  . Number of children: N/A  . Years of education: N/A   Social History Main Topics  . Smoking status: Current Every Day Smoker    Types: Cigarettes  . Smokeless tobacco: Never Used  . Alcohol use Yes     Comment: occasional  . Drug use: No  . Sexual activity: Not Asked   Other Topics Concern  . None   Social History Narrative  . None   Additional Social History:    Allergies:  No Known Allergies  Labs: No results found for this or any previous visit (from the past 48 hour(s)).  Current Facility-Administered Medications  Medication Dose Route Frequency Provider Last Rate Last Dose  . acetaminophen (TYLENOL) tablet 500 mg  500 mg Oral Q6H PRN Charlynne Pander, MD      . gabapentin (NEURONTIN) capsule 300 mg  300 mg Oral BID Thedore Mins, MD   Stopped at 08/03/17 1053  . ibuprofen (ADVIL,MOTRIN) tablet 600 mg  600 mg Oral Q4H PRN Charlynne Pander, MD      . lisinopril (PRINIVIL,ZESTRIL) tablet 20 mg  20 mg Oral Daily Thedore Mins, MD   Stopped at 08/03/17 1053  . LORazepam (ATIVAN) tablet 1 mg  1 mg Oral  Q6H PRN Charlynne Pander, MD      . risperiDONE (RISPERDAL) tablet 1 mg  1 mg Oral BID Thedore Mins, MD   Stopped at 08/03/17 1054  . traZODone (DESYREL) tablet 50 mg  50 mg Oral QHS Thedore Mins, MD       Current Outpatient Prescriptions  Medication Sig Dispense Refill  . diphenhydramine-acetaminophen (TYLENOL PM) 25-500 MG TABS tablet Take 2 tablets by mouth daily as needed (cough).    Marland Kitchen lisinopril (PRINIVIL,ZESTRIL) 20 MG tablet Take 20 mg by mouth daily.      Musculoskeletal: Strength & Muscle Tone: within  normal limits Gait & Station: normal Patient leans: N/A  Psychiatric Specialty Exam: Physical Exam  Constitutional: He is oriented to person, place, and time. He appears well-developed and well-nourished.  HENT:  Head: Normocephalic.  Respiratory: Effort normal.  Musculoskeletal: Normal range of motion.  Neurological: He is alert and oriented to person, place, and time.  Psychiatric: His behavior is normal. Thought content normal. His mood appears anxious. His speech is rapid and/or pressured. Cognition and memory are normal. He expresses impulsivity.    Review of Systems  Psychiatric/Behavioral: Negative for depression, hallucinations, memory loss, substance abuse and suicidal ideas. The patient is nervous/anxious. The patient does not have insomnia.   All other systems reviewed and are negative.   Blood pressure 135/75, pulse 80, temperature 98.1 F (36.7 C), temperature source Oral, resp. rate 18, SpO2 100 %.There is no height or weight on file to calculate BMI.  General Appearance: Casual  Eye Contact:  Good  Speech:  Pressured  Volume:  Normal  Mood:  Anxious  Affect:  Congruent  Thought Process:  Coherent, Goal Directed and Linear  Orientation:  Full (Time, Place, and Person)  Thought Content:  Tangential and grandiose  Suicidal Thoughts:  No  Homicidal Thoughts:  No  Memory:  Immediate;   Good Recent;   Good Remote;   Fair  Judgement:  Fair  Insight:  Fair  Psychomotor Activity:  Normal  Concentration:  Concentration: Good and Attention Span: Fair  Recall:  Good  Fund of Knowledge:  Good  Language:  Good  Akathisia:  No  Handed:  Right  AIMS (if indicated):     Assets:  Communication Skills Desire for Improvement Financial Resources/Insurance Housing Intimacy Leisure Time Physical Health Resilience Social Support Transportation Vocational/Educational  ADL's:  Intact  Cognition:  WNL  Sleep:        Treatment Plan Summary: Daily contact with patient  to assess and evaluate symptoms and progress in treatment and Medication management (see MAR)  Disposition: Recommend psychiatric Inpatient admission when medically cleared.TTS to seek placement  Laveda Abbe, NP 08/05/2017 3:40 PM   Patient seen for this face-to-face psychiatric evaluation, case discussed with physician extender in treatment team and formulated treatment plan. Reviewed the information documented and agree with the treatment plan.  Kyle Rivera 08/05/2017 5:27 PM

## 2017-08-05 NOTE — ED Notes (Signed)
SBAR Report received from previous nurse. Pt received calm and visible on unit. Pt denies current SI/ HI, A/V H, depression, anxiety, or pain at this time, and appears otherwise stable and free of distress. Pt reminded of camera surveillance, q 15 min rounds, and rules of the milieu. Will continue to assess. 

## 2017-08-05 NOTE — ED Notes (Signed)
Pt states he is a Vegetarian.

## 2017-08-06 NOTE — Progress Notes (Signed)
08/06/17 1403:  LRT went to pt room to offer activities, pt was eating lunch.  LRT explained the activities that were available and encouraged pt to participate when he finished eating.  Pt decided to lay down and rest after he ate.   Caroll Rancher, LRT/CTRS

## 2017-08-06 NOTE — ED Notes (Signed)
Pt behavior cooperative. Not endorsing SI/HI/AVH. Pt refusing medication regimen, Jorene Minors NP made aware. Pt appears paranoid at times. Encouragement and support provided. Special checks q 15 mins in place for safety, Video monitoring in place. Will continue to monitor.

## 2017-08-06 NOTE — ED Notes (Signed)
Pt taking a shower 

## 2017-08-06 NOTE — Consult Note (Signed)
[facility] Face-to-Face Psychiatry Consult   Reason for Consult:  Danger to self and others Referring Physician:  IVC Patient Identification: Kyle Rivera MRN:  [id number] Principal Diagnosis: Schizophreniform psychosis, affective type [facility]) Diagnosis:   Patient Active Problem List   Diagnosis Date Noted  . 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 Internal hemorrhoids [K64.8] 08/26/2012    Total Time spent with patient: 30 minutes  Subjective:   ''I was brought here because I had confrontation with the news crew and my neighbor who put bad stuffs about me on neighborhood watch app.''  Objective:  Patient was seen, evaluated and his chart reviewed and case discussed with the treatment team. Patient has become less aggressive, agitated or irritable but remains paranoid. He states that he  had weapons in his home but the police confiscated them after he threatened to shoot people at random. 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  Past Psychiatric History: As above  Risk to Self: No denies Risk to Others: Denies Prior Inpatient Therapy: Prior Inpatient Therapy: No (Pt denies. ) Prior Therapy Dates: NA Prior Therapy Facilty/Provider(s): NA Reason for Treatment: NA Prior Outpatient Therapy: Prior Outpatient Therapy: No (Pt denies. ) Prior Therapy Dates: NA Prior Therapy Facilty/Provider(s): NA Reason for Treatment: NA Does patient have an ACCT team?: No Does patient have Intensive In-House Services?  : No Does patient have [facility] services? : No Does patient have P4CC services?: No  Past Medical History:  Past Medical History:  Diagnosis Date  . Hemorrhoid   . Hypertension    History reviewed. No pertinent surgical history. Family History:  Family  History  Problem Relation Age of Onset  . Heart disease Mother    Family Psychiatric  History:Unknown Social History:  History  Alcohol Use  . Yes    Comment: occasional     History  Drug Use No    Social History   Social History  . Marital status: Single    Spouse name: N/A  . Number of children: N/A  . Years of education: N/A   Social History Main Topics  . Smoking status: Current Every Day Smoker    Types: Cigarettes  . Smokeless tobacco: Never Used  . Alcohol use Yes     Comment: occasional  . Drug use: No  . Sexual activity: Not Asked   Other Topics Concern  . None   Social History Narrative  . None   Additional Social History:    Allergies:  No Known Allergies  Labs: No results found for this or any previous visit (from the past 48 hour(s)).  Current Facility-Administered Medications  Medication Dose Route Frequency Provider Last Rate Last Dose  . acetaminophen (TYLENOL) tablet 500 mg  500 mg Oral Q6H PRN Charlynne Pander, MD      . gabapentin (NEURONTIN) capsule 300 mg  300 mg Oral BID Thedore Mins, MD   Stopped at 08/03/17 1053  . ibuprofen (ADVIL,MOTRIN) tablet 600 mg  600 mg Oral Q4H PRN Charlynne Pander, MD      . lisinopril (PRINIVIL,ZESTRIL) tablet 20 mg  20 mg Oral Daily Thedore Mins, MD   Stopped at 08/03/17 1053  . risperiDONE (RISPERDAL) tablet 1 mg  1 mg Oral BID Thedore Mins, MD  Stopped at 08/03/17 1054  . traZODone (DESYREL) tablet 50 mg  50 mg Oral QHS Thedore Mins, MD       Current Outpatient Prescriptions  Medication Sig Dispense Refill  . diphenhydramine-acetaminophen (TYLENOL PM) 25-500 MG TABS tablet Take 2 tablets by mouth daily as needed (cough).    Marland Kitchen lisinopril (PRINIVIL,ZESTRIL) 20 MG tablet Take 20 mg by mouth daily.      Musculoskeletal: Strength & Muscle Tone: within normal limits Gait & Station: normal Patient leans: N/A  Psychiatric Specialty Exam: Physical Exam  Constitutional: He is oriented  to person, place, and time. He appears well-developed and well-nourished.  HENT:  Head: Normocephalic.  Respiratory: Effort normal.  Musculoskeletal: Normal range of motion.  Neurological: He is alert and oriented to person, place, and time.  Psychiatric: His behavior is normal. His mood appears anxious. His speech is rapid and/or pressured. Thought content is paranoid and delusional. Cognition and memory are normal. He expresses impulsivity.    Review of Systems  Constitutional: Negative.   HENT: Negative.   Eyes: Negative.   Cardiovascular: Negative.   Skin: Negative.   Psychiatric/Behavioral: Negative for depression, hallucinations, memory loss, substance abuse and suicidal ideas. The patient is nervous/anxious. The patient does not have insomnia.   All other systems reviewed and are negative.   Blood pressure 135/81, pulse (!) 56, temperature 98.1 F (36.7 C), temperature source Oral, resp. rate 17, SpO2 100 %.There is no height or weight on file to calculate BMI.  General Appearance: Casual  Eye Contact:  Good  Speech:  Pressured  Volume:  Normal  Mood:  Anxious  Affect:  Constricted  Thought Process:  Coherent and Goal Directed  Orientation:  Full (Time, Place, and Person)  Thought Content:  Tangential and grandiose  Suicidal Thoughts:  denies  Homicidal Thoughts:  denies  Memory:  Immediate;   Good Recent;   Good Remote;   Fair  Judgement:  Impaired  Insight:  Shallow  Psychomotor Activity:  Normal  Concentration:  Concentration: Good and Attention Span: Fair  Recall:  Good  Fund of Knowledge:  Good  Language:  Good  Akathisia:  No  Handed:  Right  AIMS (if indicated):     Assets:  Communication Skills Desire for Improvement Financial Resources/Insurance Housing Intimacy Leisure Time Physical Health Resilience Social Support Transportation Vocational/Educational  ADL's:  Intact  Cognition:  WNL  Sleep:        Treatment Plan Summary: Daily contact  with patient to assess and evaluate symptoms and progress in treatment and Medication management (see MAR)  Disposition: Recommend psychiatric Inpatient admission when medically cleared. Thedore Mins, MD 08/06/2017 11:34 AM

## 2017-08-06 NOTE — BH Assessment (Signed)
BHH Assessment Progress Note  Per Thedore Mins, MD, this pt continues to require psychiatric hospitalization at this time.  The following facilities have been contacted to seek placement for this pt, with results as noted:  Beds available, information sent, decision pending:  [facility]   At capacity:  [facility] Doran Heater, [place] Triage Specialist [phone]

## 2017-08-06 NOTE — BHH Counselor (Signed)
Clinician spoke to [place] at [facility] and noted the pt is on their review list. Christiane Ha reported, if the pt is accepted to their facility he will call back with the accepting information.    Redmond Pulling, MS, [facility] Triage Specialist [phone]

## 2017-08-06 NOTE — BH Assessment (Signed)
Kyle Rivera Assessment Progress Note Per Akintayo MD request, this writer contacted patient's partner Kyle Rivera [phone] to gather collateral information in reference to the incident that occurred over the weekend. Partner  rendered limited information and stated she was "unaware of the details of the incident." Partner gave very vague/limited information and stated she "would rather not be involved since she was unsure of the details associated with the incident." Partner is unsure of patient's previous mental health history.

## 2017-08-06 NOTE — ED Notes (Signed)
Patient denies pain, SI/HI, AH/VH. Refused bedtime med. stated "I don't need them. I feel ok. I will talk to my DR in the morning before I take any further med.  Patient stayed in his room. Verbalized no concern. No behavior issues noted.  Staff offered support and encouragement as needed. Routine safety checks maintained. Will continue to monitor patient.  Patient remains safe on unit.

## 2017-08-07 NOTE — ED Notes (Signed)
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 Patient sister states "I am unsure of the name of the company that he works for".  Sister will call back in the morning to speak with treatment team and offer collaborative information.

## 2017-08-07 NOTE — BHH Counselor (Signed)
Re-assessment: Patient present calm and pleasant. Patient denies SI, HI and AVH.

## 2017-08-07 NOTE — ED Notes (Signed)
Patient denies SI,HI and AVH. Plan of care discussed. Encouragement and support provided and safety maintain. Q 15 min safety checks remain in place and video monitoring.

## 2017-08-07 NOTE — ED Notes (Signed)
Pt was observed taking his 3rd shower of the day. When pt was asked if there was a reason for the multiple showers, pt stated "Is there something wrong with it? Am not allowed to?" This writer told pt it just appeared to be excessive.

## 2017-08-07 NOTE — ED Notes (Signed)
Pt behavior overly suspicious. Pt asking this nurse;"If someone calls the nurses station, do you answer the phone psych ED?" Pt also inquiring if "good things" are being written in his chart. Pt laughing inappropriately at times. Encouragement and support provided. Special checks q 15 mins in place for safety, Video monitoring in place. Will continue to monitor.

## 2017-08-07 NOTE — ED Notes (Signed)
Pt taking a shower 

## 2017-08-07 NOTE — ED Notes (Signed)
Visitor at bedside.

## 2017-08-07 NOTE — ED Notes (Signed)
Pt refusing medication regimen, this nurse notified Laurie,NP. Pt appears paranoid, laughing inappropriately at times. Denies SI/HI/AVH. Encouragement and support provided. Special checks q 15 mins in place for safety, Video monitoring in place. Will continue to monitor.

## 2017-08-07 NOTE — ED Notes (Signed)
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 The brother wold like for treatment team to call him Kyle Rivera at [phone] and if he doesn't answer leave message and he will call back.

## 2017-08-07 NOTE — Progress Notes (Signed)
08/07/17 1350:  LRT went to pt room to offer activities.  Pt was eating lunch.  Pt was offered activities once he finished eating.   Caroll Rancher, LRT/CTRS

## 2017-08-07 NOTE — Progress Notes (Signed)
08/07/17 1515:  Pt came to nurses station and stated he wanted to do an activity.  Pt wanted an activity he could do by himself.  LRT sat in dayroom with pt while he played with some playing cards building a house.  Caroll Rancher, LRT/CTRS

## 2017-08-08 MED ORDER — PHENYLEPHRINE IN HARD FAT 0.25 % RE SUPP
1.0000 | Freq: Two times a day (BID) | RECTAL | Status: DC
  Administered 2017-08-08: 1 via RECTAL
  Filled 2017-08-08 (×3): qty 1

## 2017-08-08 MED ORDER — HYDROCORTISONE 2.5 % RE CREA
TOPICAL_CREAM | Freq: Every day | RECTAL | Status: DC | PRN
Start: 2017-08-08 — End: 2017-08-09
  Filled 2017-08-08: qty 28.35

## 2017-08-08 MED ORDER — POLYETHYLENE GLYCOL 3350 17 G PO PACK
17.0000 g | PACK | Freq: Every day | ORAL | Status: DC
  Administered 2017-08-08 – 2017-08-09 (×2): 17 g via ORAL
  Filled 2017-08-08 (×2): qty 1

## 2017-08-08 MED ORDER — WITCH HAZEL-GLYCERIN EX PADS
MEDICATED_PAD | CUTANEOUS | Status: DC | PRN
  Administered 2017-08-08: 1 via TOPICAL
  Filled 2017-08-08: qty 100

## 2017-08-08 NOTE — ED Notes (Signed)
Up to the bathroom, pain increased after suppository

## 2017-08-08 NOTE — ED Notes (Addendum)
Pt's brother Associate Professor)  called (release on chart) and reports that the pt has no hx of mental illness and that there is no hx in the family.  He also reports that the pt is very health conscious (vegan/vegitarian) and may be resistant to taking pills.

## 2017-08-08 NOTE — ED Notes (Signed)
Patient refused al medication. Patient stated "My emotions and behavior are fine so I'll pass on the psychiatric medication and my hemorrhoids are fine right now so I don't need the suppository. Patient educated on benefits of taking medications and the risk associated not taking them. Patient continues to refuses politely at this time. Encouragement and support provided and safety maintain. Q 15 min safety checks remain in place and video monitoring.

## 2017-08-08 NOTE — ED Notes (Signed)
Up to the bathroom 

## 2017-08-08 NOTE — Progress Notes (Signed)
08/08/17 1414:  Pt with visitor.  Caroll Rancher, LRT/CTRS

## 2017-08-08 NOTE — ED Notes (Signed)
Up to the bathroom to shower 

## 2017-08-08 NOTE — ED Notes (Signed)
Patient denies SI,HI and AVH at this time. Patient is calm and cooperative. Plan of care discussed. Encouragement and support provided and safety maintain. Q 15 min safety checks remain in place and video monitoring.

## 2017-08-08 NOTE — ED Notes (Signed)
Pt reported he did not think he needed to take medications.

## 2017-08-08 NOTE — ED Notes (Signed)
Dr Lenore Cordia and Jacki Cones NP into see.  Importance of taking medications discussed with pt by MD, pt verbalized understanding.

## 2017-08-08 NOTE — Consult Note (Addendum)
[facility] Face-to-Face Psychiatry Consult   Reason for Consult:  Paranoid and bizarre behavior Referring Physician:  EDP Patient Identification: Kyle Rivera MRN:  [id number] Principal Diagnosis: Schizophreniform psychosis, affective type [facility]) Diagnosis:   Patient Active Problem List   Diagnosis Date Noted  . 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 Internal hemorrhoids [K64.8] 08/26/2012    Total Time spent with patient: 45 minutes  Subjective:   Kyle Rivera is a 36 y.o. male patient admitted under IVC, placed by sheriff's department after exhibiting threatening and aggressive behavior toward people in his neighborhood.  HPI:  Pt was seen and chart reviewed with treatment team and Dr Jannifer Franklin. Pt continues to decline all medications while in the SAPPU. Pt stated he respects our opinion but he doesn't feel he needs medications. Pt was informed that he needs to comply with his treatment plan in order to be considered safe for discharge. Pt has been calm and cooperative and appropriate while in the SAP[facility]. Pt does appear to be experiencing some paranoid thought processes. Inpatient psychiatric admission recommended.   Past Psychiatric History: as above  Risk to Self: denies Risk to Others: Denies Prior Inpatient Therapy: Prior Inpatient Therapy: No (Pt denies. ) Prior Therapy Dates: NA Prior Therapy Facilty/Provider(s): NA Reason for Treatment: NA Prior Outpatient Therapy: Prior Outpatient Therapy: No (Pt denies. ) Prior Therapy Dates: NA Prior Therapy Facilty/Provider(s): NA Reason for Treatment: NA Does patient have an ACCT team?: No Does patient have Intensive In-House Services?  : No Does patient have [facility] services? : No Does patient have P4CC services?: No  Past Medical History:  Past Medical History:  Diagnosis Date  . Hemorrhoid   . Hypertension    History reviewed. No pertinent surgical history. Family History:  Family History  Problem  Relation Age of Onset  . Heart disease Mother    Family Psychiatric  History: Unknown Social History:  History  Alcohol Use  . Yes    Comment: occasional     History  Drug Use No    Social History   Social History  . Marital status: Single    Spouse name: N/A  . Number of children: N/A  . Years of education: N/A   Social History Main Topics  . Smoking status: Current Every Day Smoker    Types: Cigarettes  . Smokeless tobacco: Never Used  . Alcohol use Yes     Comment: occasional  . Drug use: No  . Sexual activity: Not Asked   Other Topics Concern  . None   Social History Narrative  . None   Additional Social History:    Allergies:  No Known Allergies  Labs: No results found for this or any previous visit (from the past 48 hour(s)).  Current Facility-Administered Medications  Medication Dose Route Frequency Provider Last Rate Last Dose  . acetaminophen (TYLENOL) tablet 500 mg  500 mg Oral Q6H PRN Charlynne Pander, MD      . gabapentin (NEURONTIN) capsule 300 mg  300 mg Oral BID Thedore Mins, MD   Stopped at 08/03/17 1053  . ibuprofen (ADVIL,MOTRIN) tablet 600 mg  600 mg Oral Q4H PRN Charlynne Pander, MD      . lisinopril (PRINIVIL,ZESTRIL) tablet 20 mg  20 mg Oral Daily Thedore Mins, MD   Stopped at 08/03/17 1053  . risperiDONE (RISPERDAL) tablet 1 mg  1 mg Oral BID Thedore Mins, MD   Stopped at 08/03/17 1054  . traZODone (DESYREL) tablet 50 mg  50  mg Oral QHS Thedore Mins, MD       Current Outpatient Prescriptions  Medication Sig Dispense Refill  . diphenhydramine-acetaminophen (TYLENOL PM) 25-500 MG TABS tablet Take 2 tablets by mouth daily as needed (cough).    Marland Kitchen lisinopril (PRINIVIL,ZESTRIL) 20 MG tablet Take 20 mg by mouth daily.      Musculoskeletal: Strength & Muscle Tone: within normal limits Gait & Station: normal Patient leans: N/A  Psychiatric Specialty Exam: Physical Exam  Constitutional: He is oriented to person, place,  and time. He appears well-developed and well-nourished.  Respiratory: Effort normal.  Musculoskeletal: Normal range of motion.  Neurological: He is alert and oriented to person, place, and time.  Psychiatric: His speech is normal and behavior is normal. His mood appears anxious. Thought content is paranoid. Cognition and memory are normal. He expresses impulsivity.    Review of Systems  Psychiatric/Behavioral: Negative for depression, hallucinations, memory loss, substance abuse and suicidal ideas. The patient is nervous/anxious. The patient does not have insomnia.   All other systems reviewed and are negative.   Blood pressure 120/63, pulse (!) 58, temperature 98.7 F (37.1 C), temperature source Oral, resp. rate 20, SpO2 100 %.There is no height or weight on file to calculate BMI.  General Appearance: Casual  Eye Contact:  Good  Speech:  Clear and Coherent and Normal Rate  Volume:  Normal  Mood:  Anxious and paranoid  Affect:  Congruent  Thought Process:  Coherent and Linear  Orientation:  Full (Time, Place, and Person)  Thought Content:  Logical  Suicidal Thoughts:  No  Homicidal Thoughts:  No  Memory:  Immediate;   Good Recent;   Good Remote;   Fair  Judgement:  Fair  Insight:  Fair  Psychomotor Activity:  Normal  Concentration:  Concentration: Good and Attention Span: Good  Recall:  Good  Fund of Knowledge:  Good  Language:  Good  Akathisia:  No  Handed:  Right  AIMS (if indicated):     Assets:  Architect Housing Intimacy Leisure Time Physical Health Social Support Vocational/Educational  ADL's:  Intact  Cognition:  WNL  Sleep:        Treatment Plan Summary: Daily contact with patient to assess and evaluate symptoms and progress in treatment and Medication management  -Crisis Stabilization Continue these medications: -Gabapentin 300 mg BID for agitation -Risperidone 1 mg BID for mood stabilization -Trazodone 50 mg  QHS for sleep  Disposition: Recommend psychiatric Inpatient admission when medically cleared. TTS to seek placement  Laveda Abbe, NP 08/08/2017 10:14 AM  Patient seen face-to-face for psychiatric evaluation, chart reviewed and case discussed with the physician extender and developed treatment plan. Reviewed the information documented and agree with the treatment plan. Thedore Mins, MD

## 2017-08-08 NOTE — ED Notes (Signed)
On the phone 

## 2017-08-08 NOTE — ED Notes (Addendum)
Time approx.Pt's sister called (release on chart) and reports that nothing like this has occurred before. Sister is aware that pt is not going to be dc'd today and will be re-evaluated tomorrow.   She also spoke with TTS.

## 2017-08-09 ENCOUNTER — Inpatient Hospital Stay: Admission: EM | Admit: 2017-08-09 | Payer: Self-pay | Source: Intra-hospital | Admitting: Psychiatry

## 2017-08-09 NOTE — Discharge Instructions (Signed)
Outpatient follow up for Mental Health Counseling and Support:  [facility] [place] [facility] [address], [place], [place] [address] Monday - Friday: 8:30am-12:00pm / 1:00pm-2:30pm  Contact Crisis [employer] [phone]  [place] [phone]  General Contact [phone]

## 2017-08-09 NOTE — [facility] Suicide Risk Assessment (Signed)
Suicide Risk Assessment  Discharge Assessment   [facility] Discharge Suicide Risk Assessment   Principal Problem: Schizophreniform psychosis, affective type [facility]) Discharge Diagnoses:  Patient Active Problem List   Diagnosis Date Noted  . 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 Internal hemorrhoids [K64.8] 08/26/2012    Total Time spent with patient: 45 minutes  Musculoskeletal: Strength & Muscle Tone: within normal limits Gait & Station: normal Patient leans: N/A  Psychiatric Specialty Exam: Physical Exam  Constitutional: He is oriented to person, place, and time. He appears well-developed and well-nourished.  HENT:  Head: Normocephalic.  Respiratory: Effort normal.  Musculoskeletal: Normal range of motion.  Neurological: He is alert and oriented to person, place, and time.  Psychiatric: He has a normal mood and affect. His speech is normal and behavior is normal. Judgment and thought content normal. Cognition and memory are normal.   Review of Systems  Psychiatric/Behavioral: Negative for depression, hallucinations, memory loss, substance abuse and suicidal ideas. The patient is not nervous/anxious and does not have insomnia.   All other systems reviewed and are negative.  Blood pressure 127/67, pulse (!) 57, temperature 98.2 F (36.8 C), temperature source Oral, resp. rate 17, SpO2 100 %.There is no height or weight on file to calculate BMI. General Appearance: Casual Eye Contact:  Good Speech:  Clear and Coherent and Normal Rate Volume:  Normal Mood:  Euthymic Affect:  Congruent Thought Process:  Coherent, Goal Directed and Linear Orientation:  Full (Time, Place, and Person) Thought Content:  Logical Suicidal Thoughts:  No Homicidal Thoughts:  No Memory:  Immediate;   Good Recent;   Good Remote;   Fair Judgement:  Fair Insight:  Fair Psychomotor Activity:  Normal Concentration:  Concentration: Good and Attention Span: Good Recall:   Good Fund of Knowledge:  Good Language:  Good Akathisia:  No Handed:  Right   Mental Status Per Nursing Assessment::   On Admission:   Paranoid  Demographic Factors:  Male  Loss Factors: NA  Historical Factors: Impulsivity  Risk Reduction Factors:   Sense of responsibility to family, Employed, Living with another person, especially a relative and Positive social support  Continued Clinical Symptoms:  Depression:   Impulsivity  Cognitive Features That Contribute To Risk:  Closed-mindedness    Suicide Risk:  Minimal: No identifiable suicidal ideation.  Patients presenting with no risk factors but with morbid ruminations; may be classified as minimal risk based on the severity of the depressive symptoms    Plan Of Care/Follow-up recommendations:  Activity:  as tolerated Diet:  Heart Healthy  Laveda Abbe, NP 08/09/2017, 11:17 AM

## 2017-08-09 NOTE — Consult Note (Signed)
[facility] Face-to-Face Psychiatry Consult   Reason for Consult:  Paranoid and aggressive behavior Referring Physician:  EDP Patient Identification: Kyle Rivera MRN:  [id number] Principal Diagnosis: Schizophreniform psychosis, affective type [facility]) Diagnosis:   Patient Active Problem List   Diagnosis Date Noted  . 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 Internal hemorrhoids [K64.8] 08/26/2012    Total Time spent with patient: 45 minutes  Subjective:   Kyle Rivera is a 36 y.o. male patient admitted with paranoid and aggressive behavior.  HPI:  Pt was seen and chart reviewed with treatment team and Dr Jannifer Franklin.  Pt denies suicidal/homicidal ideation, denies auditory/visual hallucinations and does not appear to be responding to internal stimuli. Pt has remained calm, cooperative, appropriate and polite while in the SAPPU. Pt has not been disruptive or combative. Pt will be referred to outpatient resources at  [facility] [place]. Collateral was gathered from Pt's family and girlfriend (see notes) that  State there is no family mental health history and Pt has no mental health history. Pt is psychiatrically cleared for discharge home and outpatient follow up.   Past Psychiatric History: As above  Risk to Self: None Risk to Others: None Prior Inpatient Therapy: Prior Inpatient Therapy: No (Pt denies. ) Prior Therapy Dates: NA Prior Therapy Facilty/Provider(s): NA Reason for Treatment: NA Prior Outpatient Therapy: Prior Outpatient Therapy: No (Pt denies. ) Prior Therapy Dates: NA Prior Therapy Facilty/Provider(s): NA Reason for Treatment: NA Does patient have an ACCT team?: No Does patient have Intensive In-House Services?  : No Does patient have [facility] services? : No Does patient have P4CC services?: No  Past Medical History:  Past Medical History:  Diagnosis Date  . Hemorrhoid   . Hypertension    History reviewed. No pertinent surgical  history. Family History:  Family History  Problem Relation Age of Onset  . Heart disease Mother    Family Psychiatric  History: Unknown Social History:  History  Alcohol Use  . Yes    Comment: occasional     History  Drug Use No    Social History   Social History  . Marital status: Single    Spouse name: N/A  . Number of children: N/A  . Years of education: N/A   Social History Main Topics  . Smoking status: Current Every Day Smoker    Types: Cigarettes  . Smokeless tobacco: Never Used  . Alcohol use Yes     Comment: occasional  . Drug use: No  . Sexual activity: Not Asked   Other Topics Concern  . None   Social History Narrative  . None   Additional Social History:    Allergies:  No Known Allergies  Labs: No results found for this or any previous visit (from the past 48 hour(s)).  Current Facility-Administered Medications  Medication Dose Route Frequency Provider Last Rate Last Dose  . acetaminophen (TYLENOL) tablet 500 mg  500 mg Oral Q6H PRN Charlynne Pander, MD   500 mg at 08/08/17 1320  . gabapentin (NEURONTIN) capsule 300 mg  300 mg Oral BID Gissella Niblack, MD   Stopped at 08/03/17 1053  . hydrocortisone (ANUSOL-HC) 2.5 % rectal cream   Rectal Daily PRN Laveda Abbe, NP      . ibuprofen (ADVIL,MOTRIN) tablet 600 mg  600 mg Oral Q4H PRN Charlynne Pander, MD      . lisinopril (PRINIVIL,ZESTRIL) tablet 20 mg  20 mg Oral Daily Thedore Mins, MD   Stopped at  08/03/17 1053  . phenylephrine ((USE for PREPARATION-H)) 0.25 % suppository 1 suppository  1 suppository Rectal BID Laveda Abbe, NP   1 suppository at 08/08/17 1253  . polyethylene glycol (MIRALAX / GLYCOLAX) packet 17 g  17 g Oral Daily Laveda Abbe, NP   17 g at 08/09/17 0911  . risperiDONE (RISPERDAL) tablet 1 mg  1 mg Oral BID Thedore Mins, MD   Stopped at 08/03/17 1054  . traZODone (DESYREL) tablet 50 mg  50 mg Oral QHS Thedore Mins, MD      . witch  hazel-glycerin (TUCKS) pad   Topical PRN Lorre Nick, MD   1 application at 08/08/17 1933   Current Outpatient Prescriptions  Medication Sig Dispense Refill  . diphenhydramine-acetaminophen (TYLENOL PM) 25-500 MG TABS tablet Take 2 tablets by mouth daily as needed (cough).    Marland Kitchen lisinopril (PRINIVIL,ZESTRIL) 20 MG tablet Take 20 mg by mouth daily.      Musculoskeletal: Strength & Muscle Tone: within normal limits Gait & Station: normal Patient leans: N/A  Psychiatric Specialty Exam: Physical Exam  Constitutional: He is oriented to person, place, and time. He appears well-developed and well-nourished.  HENT:  Head: Normocephalic.  Respiratory: Effort normal.  Musculoskeletal: Normal range of motion.  Neurological: He is alert and oriented to person, place, and time.  Psychiatric: He has a normal mood and affect. His speech is normal and behavior is normal. Judgment and thought content normal. Cognition and memory are normal.    Review of Systems  Psychiatric/Behavioral: Negative for depression, hallucinations, memory loss, substance abuse and suicidal ideas. The patient is not nervous/anxious and does not have insomnia.   All other systems reviewed and are negative.   Blood pressure 127/67, pulse (!) 57, temperature 98.2 F (36.8 C), temperature source Oral, resp. rate 17, SpO2 100 %.There is no height or weight on file to calculate BMI.  General Appearance: Casual  Eye Contact:  Good  Speech:  Clear and Coherent and Normal Rate  Volume:  Normal  Mood:  Euthymic  Affect:  Congruent  Thought Process:  Coherent, Goal Directed and Linear  Orientation:  Full (Time, Place, and Person)  Thought Content:  Logical  Suicidal Thoughts:  No  Homicidal Thoughts:  No  Memory:  Immediate;   Good Recent;   Good Remote;   Fair  Judgement:  Fair  Insight:  Fair  Psychomotor Activity:  Normal  Concentration:  Concentration: Good and Attention Span: Good  Recall:  Good  Fund of  Knowledge:  Good  Language:  Good  Akathisia:  No  Handed:  Right  AIMS (if indicated):     Assets:  Architect Housing Intimacy Leisure Time Physical Health Resilience Social Support Transportation Vocational/Educational  ADL's:  Intact  Cognition:  WNL  Sleep:        Treatment Plan Summary: Plan Schizophreniform psychosis, affective type (HCC)  Discharge Home Follow up with [facility] [place] for therapy  Avoid the use of alcohol and illicit drugs  Disposition: No evidence of imminent risk to self or others at present.   Patient does not meet criteria for psychiatric inpatient admission. Supportive therapy provided about ongoing stressors. Discussed crisis plan, support from social network, calling 911, coming to the Emergency Department, and calling Suicide Hotline.  Laveda Abbe, NP 08/09/2017 11:07 AM  Patient seen face-to-face for psychiatric evaluation, chart reviewed and case discussed with the physician extender and developed treatment plan. Reviewed the information documented and  agree with the treatment plan. Corena Pilgrim, MD

## 2017-08-09 NOTE — ED Notes (Signed)
Discharge Note:  Patient given AVS and questions/concerns addressed. Patient denies SI/HI/AVH at this time. Patient belongings returned to him. Patient was ambulatory and discharged home per MD orders. Patient signed e-signature.

## 2018-10-04 IMAGING — US US SCROTUM
1 series · 14 of 25 positions shown · non-contrast
Comparison: None.

CLINICAL DATA: Right testicular mass for several years

EXAM:
SCROTAL ULTRASOUND
DOPPLER ULTRASOUND OF THE TESTICLES
TECHNIQUE: Complete ultrasound examination of the testicles, epididymis, and
other scrotal structures was performed. Color and spectral Doppler
ultrasound were also utilized to evaluate blood flow to the
testicles.

[Series 1: us scrotum · 0.05mm/px · 14 of 115 slices shown]
[im 1/115]
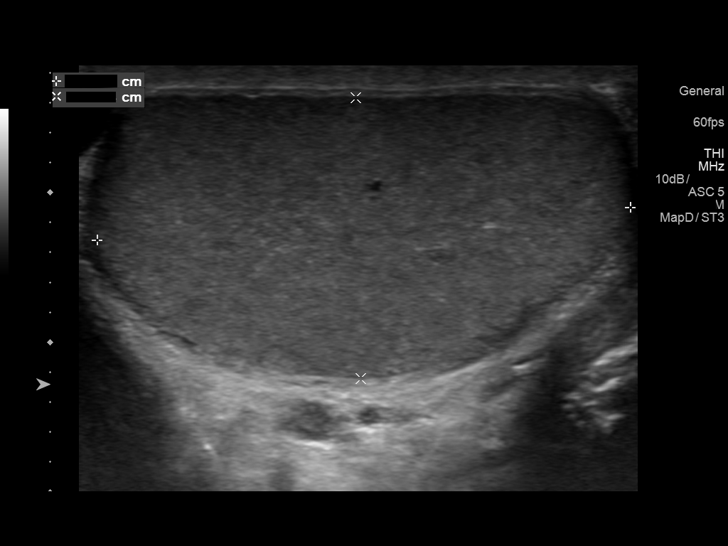
[im 10/115]
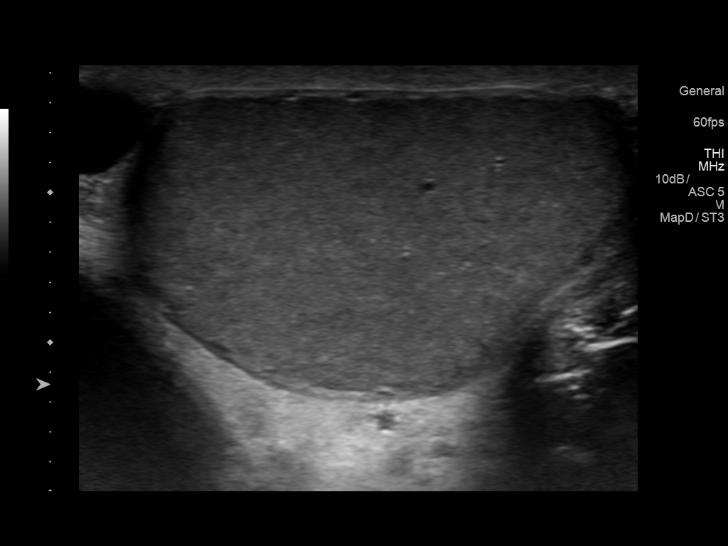
[im 20/115]
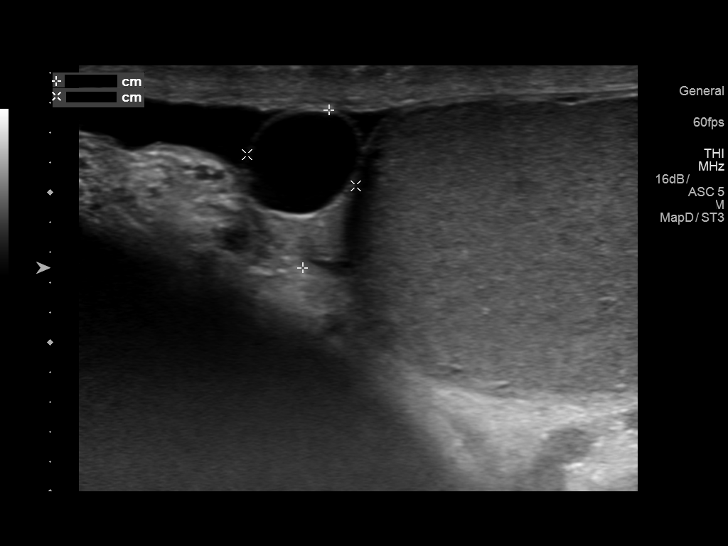
[im 29/115]
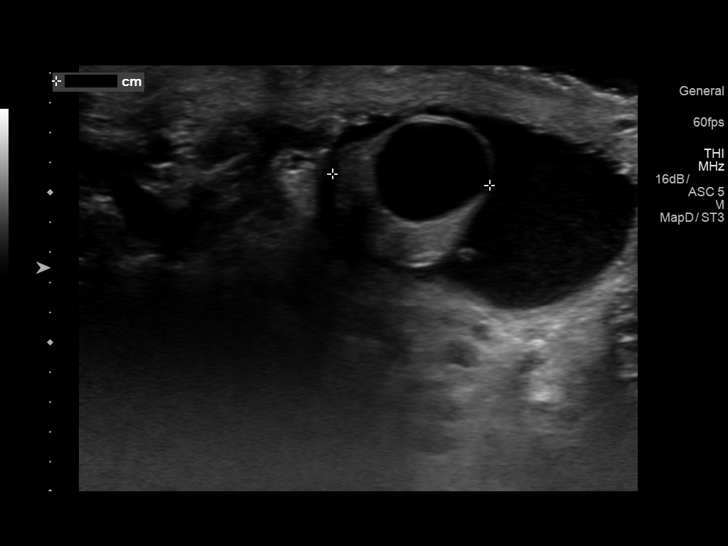
[im 39/115]
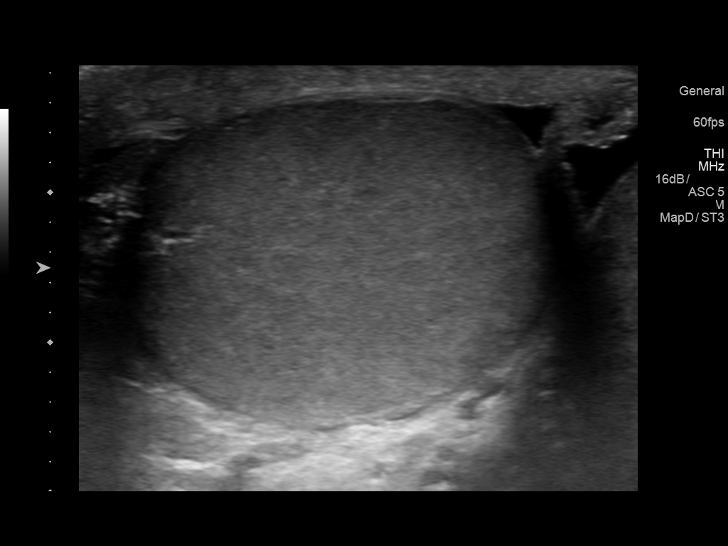
[im 43/115]
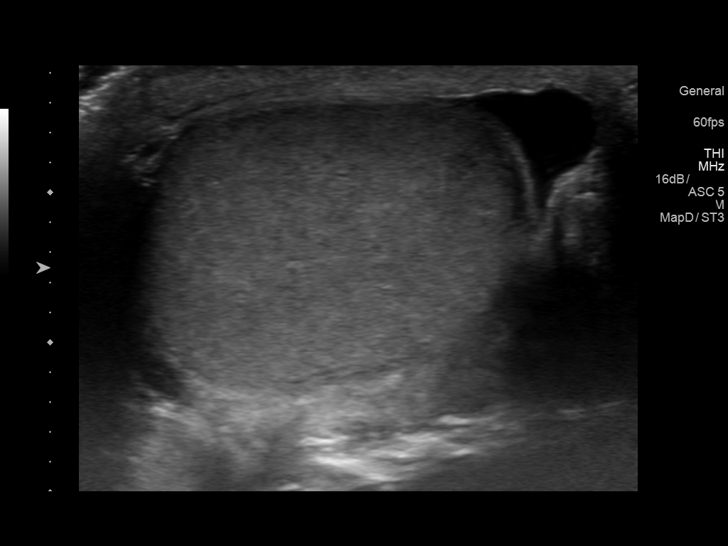
[im 53/115]
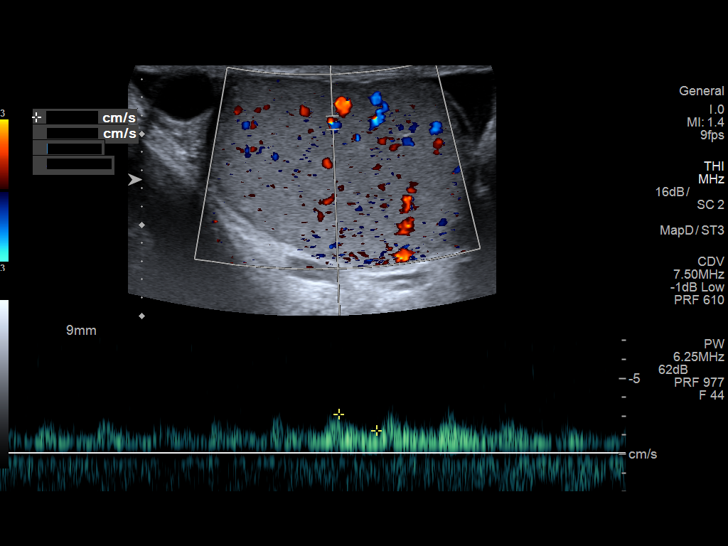
[im 62/115]
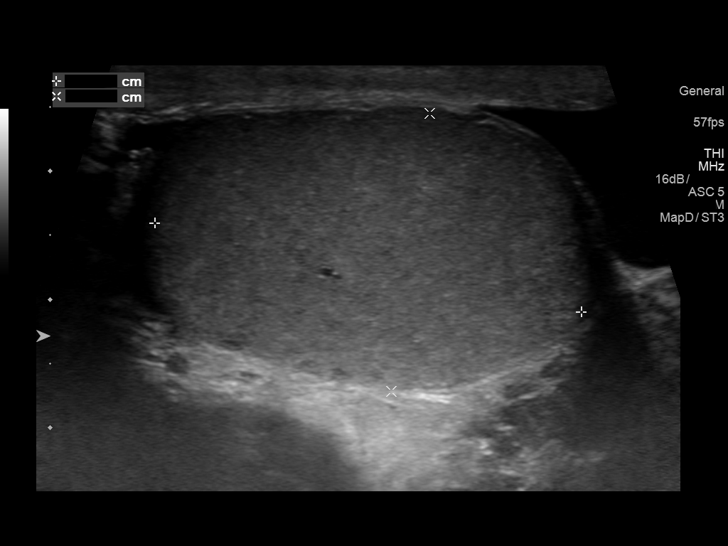
[im 72/115]
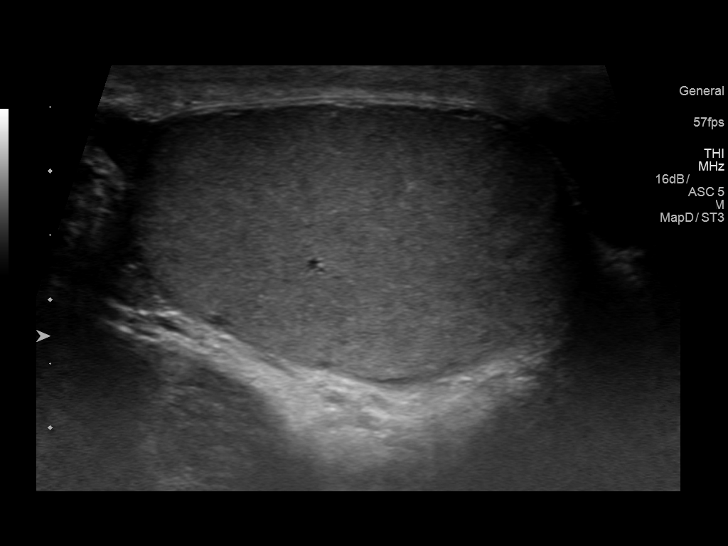
[im 77/115]
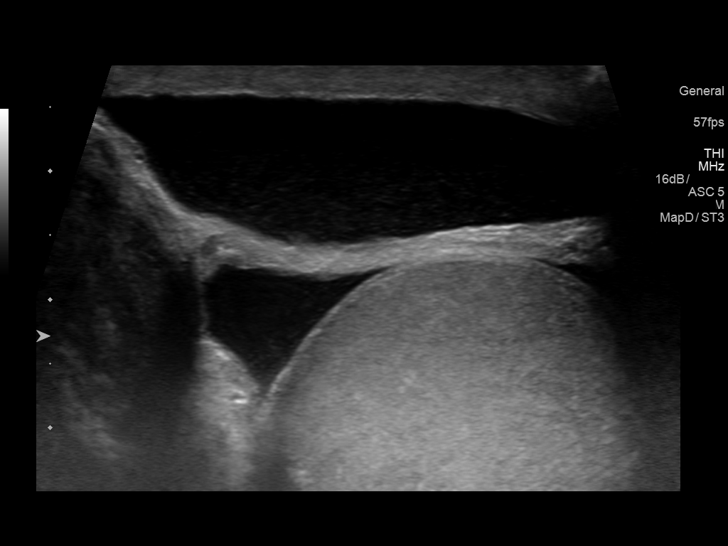
[im 86/115]
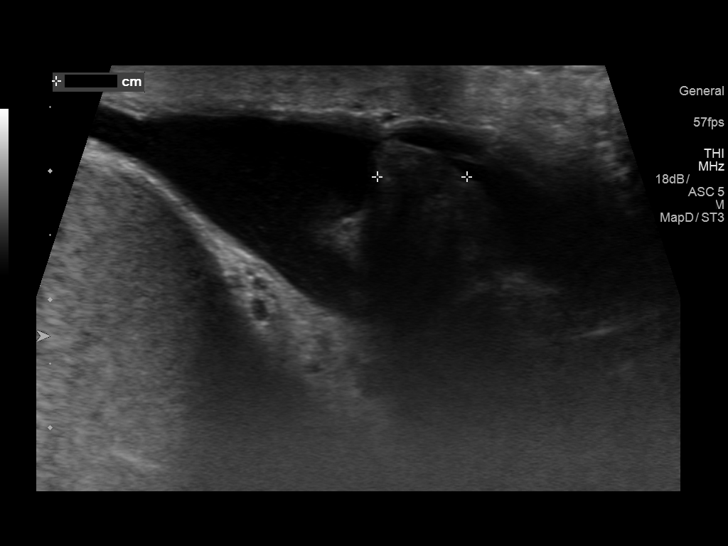
[im 96/115]
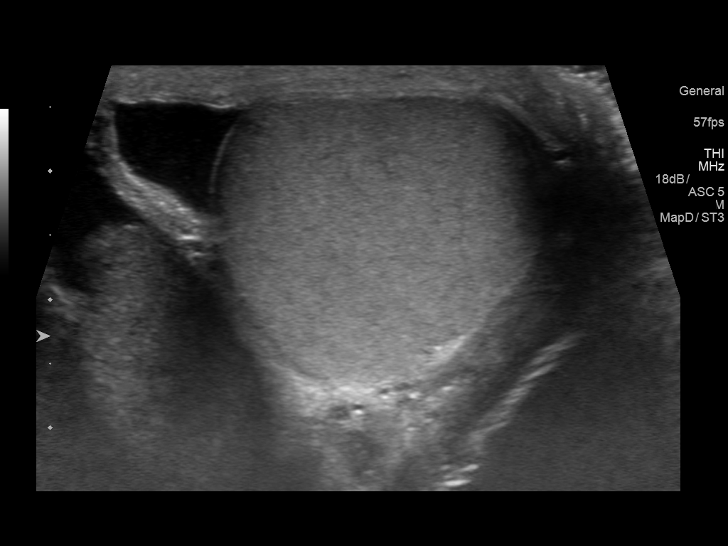
[im 105/115]
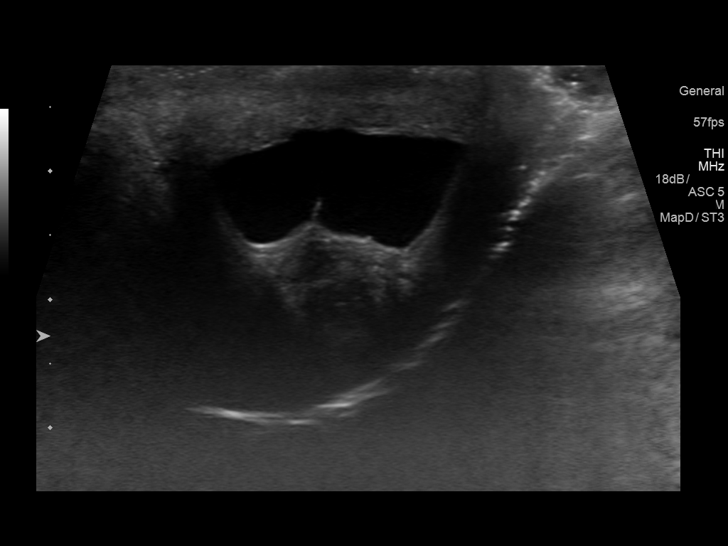
[im 115/115]
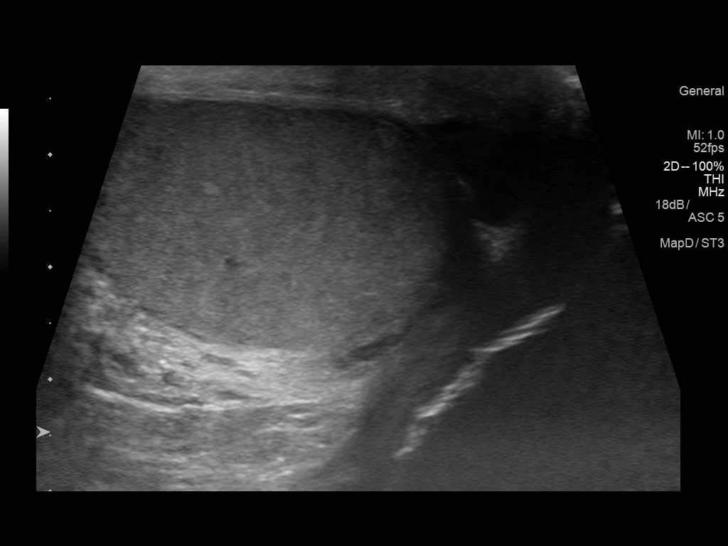

[14 of 25 positions shown; findings below may reference images not displayed]

FINDINGS: Right testicle

Measurements: 3.6 x 1.9 x 2.6 cm. No mass or microlithiasis
visualized.

Left testicle

Measurements: 3.4 x 2.2 x 2.5 cm. No mass or microlithiasis
visualized.

Right epididymis:  Cyst measuring 0.7 x 0.7 x 0.8 cm

Left epididymis:  Normal in size and appearance.

Hydrocele:  Bilateral hydroceles, right greater than left

Varicocele:  Small right varicocele

Pulsed Doppler interrogation of both testes demonstrates normal low
resistance arterial and venous waveforms bilaterally.
IMPRESSION: 1. Negative for testicular torsion or solid or cystic mass within
the testes.
2. Right epididymal cyst measuring 8 mm
3. Small medium bilateral hydroceles
4. Small right varicocele, could consider nonemergent CT imaging to
exclude retroperitoneal abnormality.
